# Patient Record
Sex: Male | Born: 1990 | Race: White | Hispanic: No | Marital: Single | Smoking: Former smoker
Health system: Southern US, Community
[De-identification: ages and names within clinical notes are randomized; demographics above are authoritative.]

## PROBLEM LIST (undated history)

## (undated) DIAGNOSIS — L720 Epidermal cyst: Secondary | ICD-10-CM

## (undated) DIAGNOSIS — B009 Herpesviral infection, unspecified: Secondary | ICD-10-CM

## (undated) HISTORY — PX: OTHER SURGICAL HISTORY: SHX169

## (undated) HISTORY — DX: Epidermal cyst: L72.0

---

## 2015-04-10 ENCOUNTER — Telehealth: Payer: Self-pay | Admitting: Surgery

## 2015-04-10 NOTE — Telephone Encounter (Signed)
Talked with patient's mother Darl PikesSusan on the phone. Darl PikesSusan reports that patient is having some pain and discomfort, but no fever or site irritation. I suggested that the patient should try Tylenol or Motrin to help relieve his pain. I directed Darl PikesSusan to take the patient to the ED if he develops a high fever, severe pain, or irritation / drainage at lipoma site. Darl PikesSusan confirmed understanding of directions and information.

## 2015-04-10 NOTE — Telephone Encounter (Signed)
Please call patients mother, Darl PikesSusan. Patient has an appointment with Dr Orvis BrillLoflin on Wednesday. He is a new patient with a possible Lipoma on his back. His mother states he is in a lot of pain and concerned it has become infected. I explained to her that we can't write any prescriptions until he has been seen in the office. She would like some suggestions on what she can give him and if she needs to take him to the ER. Thanks

## 2015-04-12 ENCOUNTER — Ambulatory Visit (INDEPENDENT_AMBULATORY_CARE_PROVIDER_SITE_OTHER): Payer: Self-pay | Admitting: Surgery

## 2015-04-12 ENCOUNTER — Encounter: Payer: Self-pay | Admitting: Surgery

## 2015-04-12 ENCOUNTER — Ambulatory Visit: Payer: Self-pay | Admitting: Surgery

## 2015-04-12 VITALS — BP 136/88 | HR 112 | Temp 98.3°F | Ht 70.0 in | Wt 297.0 lb

## 2015-04-12 DIAGNOSIS — L720 Epidermal cyst: Secondary | ICD-10-CM | POA: Insufficient documentation

## 2015-04-12 MED ORDER — AMOXICILLIN-POT CLAVULANATE 875-125 MG PO TABS: 1.0000 | ORAL_TABLET | Freq: Two times a day (BID) | ORAL | Status: DC

## 2015-04-12 NOTE — Progress Notes (Signed)
Subjective:     Patient ID: Matthew Gonzales, male   DOB: 1991/03/19, 24 y.o.   MRN: [id number]  HPI 24 yr old male with mass to back.  Patient states he has had the area for years.  He feels over the past 2 weeks it has been growing in size, he also states it has become painful and having some redness for the past few days.  Patient states he has not had it become red or painful before, has never noticed drainage from the area.  He also denies having any other skin lesions.  He denies any fever, chills, sob, chest pain, abd pain, n/v, constipation, diarrhea, or dysuria.   History reviewed. No pertinent past medical history. Past Surgical History  Procedure Laterality Date  . No past surgeries     Family History  Problem Relation Age of Onset  . Hypothyroidism Mother   . Hyperlipidemia Father   . Pancreatic cancer Maternal Grandmother   . Stroke Paternal Grandmother   . Cataracts Paternal Grandfather    Social History   Social History  . Marital Status: Single    Spouse Name: N/A  . Number of Children: N/A  . Years of Education: N/A   Social History Main Topics  . Smoking status: Current Every Day Smoker -- 0.50 packs/day    Types: Cigarettes  . Smokeless tobacco: None  . Alcohol Use: 0.0 oz/week    0 Standard drinks or equivalent per week     Comment: rarely  . Drug Use: No  . Sexual Activity: Not Asked   Other Topics Concern  . None   Social History Narrative  . None    Current outpatient prescriptions:  .  acetaminophen (TYLENOL) 500 MG tablet, Take 500 mg by mouth every 6 (six) hours as needed., Disp: , Rfl:  .  amoxicillin-clavulanate (AUGMENTIN) 875-125 MG tablet, Take 1 tablet by mouth 2 (two) times daily., Disp: 14 tablet, Rfl: 0 No Known Allergies   Review of Systems  Constitutional: Negative for fever, chills, activity change and fatigue.  HENT: Negative for congestion and sore throat.   Respiratory: Negative for cough, chest tightness and shortness of  breath.   Cardiovascular: Negative for chest pain, palpitations and leg swelling.  Gastrointestinal: Negative for nausea, vomiting, abdominal pain, diarrhea, constipation and abdominal distention.  Genitourinary: Negative for dysuria and hematuria.  Musculoskeletal: Negative for back pain and joint swelling.  Skin: Positive for color change, rash and wound. Negative for pallor.  Neurological: Negative for weakness and headaches.  Hematological: Negative for adenopathy. Does not bruise/bleed easily.  Psychiatric/Behavioral: Negative for confusion and agitation.       Filed Vitals:   04/12/15 1408  BP: 136/88  Pulse: 112  Temp: 98.3 F (36.8 C)    Objective:   Physical Exam  Constitutional: He is oriented to person, place, and time. He appears well-developed and well-nourished. No distress.  HENT:  Head: Normocephalic and atraumatic.  Right Ear: External ear normal.  Left Ear: External ear normal.  Nose: Nose normal.  Mouth/Throat: Oropharynx is clear and moist. No oropharyngeal exudate.  Eyes: Conjunctivae are normal. Pupils are equal, round, and reactive to light. No scleral icterus.  Neck: Normal range of motion. Neck supple. No tracheal deviation present. No thyromegaly present.  Cardiovascular: Normal rate, regular rhythm, normal heart sounds and intact distal pulses.  Exam reveals no gallop and no friction rub.   No murmur heard. Pulmonary/Chest: Effort normal and breath sounds normal. No respiratory distress. He has  no wheezes. He has no rales.  Abdominal: Soft. Bowel sounds are normal. He exhibits no distension. There is no tenderness. There is no rebound and no guarding.  Musculoskeletal: Normal range of motion. He exhibits no edema or tenderness.  Lymphadenopathy:    He has no cervical adenopathy.  Neurological: He is alert and oriented to person, place, and time. No cranial nerve deficit.  Skin: Skin is warm and dry. There is erythema.  midback approximately T7-8 area  with EIC, punctum could be seen but no drainage, some erythema but no fluctuance, some tenderness  Psychiatric: He has a normal mood and affect. His behavior is normal. Judgment and thought content normal.  Vitals reviewed.      Assessment:     24 yr old male with infected epidermal inclusion cyst of the mid back    Plan:     I discussed the risks, benefits, treatment options, alternatives and expected outcomes with the patient and his mother.  I explained that I would start him on an antibiotic to decrease the swelling and erythema surrounding the area.  I discussed that excision of the mass while in an inflamed state increases the likelihood of infection and recurrence of the lesion.  I also discussed that the procedure could be done in the office, by numbing the area around and excising the tissue.  I discussed the risk of bleeding, infection and recurrence of the lesion that comes with removal.  Patient and mother given opportunity to ask questions and have them answered.  Will set up for in office procedure for removal of midback EIC in 2-3 weeks.

## 2015-04-12 NOTE — Patient Instructions (Signed)
Please finish your course of antibiotic.  I would want for you to stop smoking for your own good.  Try over-the-counter Nexium, Pepcid and Maalox for your abdominal discomfort.

## 2015-04-19 ENCOUNTER — Telehealth: Payer: Self-pay | Admitting: Surgery

## 2015-04-19 MED ORDER — SULFAMETHOXAZOLE-TRIMETHOPRIM 800-160 MG PO TABS: 1.0000 | ORAL_TABLET | Freq: Two times a day (BID) | ORAL | Status: DC

## 2015-04-19 NOTE — Telephone Encounter (Signed)
Patient has called and wanted to inform nurse that all antibiotic has been completed as of this morning for possible infection/lipoma on back. He states that the place on back has not completely went away with pain and redness. Please call patient back .   Patient states that he will be at work till 4:00pm. If he will need another antibiotic he stated to call it in to his pharmacy and just leave him a detailed message. He will return the phone call if he is unable to answer.

## 2015-04-19 NOTE — Telephone Encounter (Signed)
Returned phone call to patient at this time. Denies fever.   Spoke with Dr. Tonita CongWoodham regarding this. He ordered Bactrim DS BID x 7 days.  Informed patient that this medication has been sent to [facility] at this time.  Encouraged patient to call in if he develops fever or if after finishing antibiotics that this area is still red and painful. Pt verbalizes understanding of conversation.

## 2015-04-19 NOTE — Telephone Encounter (Signed)
Patient has called again wanting to know if we will be calling in another antibiotic for him. Please call him and advise before the end of todays work day. Thank you.

## 2015-04-26 ENCOUNTER — Telehealth: Payer: Self-pay | Admitting: Surgery

## 2015-04-26 NOTE — Telephone Encounter (Signed)
Patient returned phone call at this time. He explains that area of "cyst" is slightly smaller after 2nd round of antibiotics, is slightly pink which is much better than before, and denies fever. But, he is concerned that this will not be well enough to excise on 12/27 with Dr. Orvis BrillLoflin.  I told him that unless it looks the same, worse, or he develops a fever; we will leave him off antibiotics currently but I did place him on schedule to see Dr. Tonita CongWoodham on 12/20 to make sure that this area does not need anything further prior to scheduled excision.  Encouraged patient to call if symptoms become worse or a fever develops.

## 2015-04-26 NOTE — Telephone Encounter (Signed)
Called patient back and had to leave a voicemail to return my call. ?

## 2015-04-26 NOTE — Telephone Encounter (Signed)
Patient called for an update, per patient he finished his ABX for the cyst on his back. Per patient swollen has gone down a little but still red and still there. Pain comes and goes.

## 2015-05-03 ENCOUNTER — Ambulatory Visit: Payer: Self-pay | Admitting: General Surgery

## 2015-05-04 ENCOUNTER — Ambulatory Visit (INDEPENDENT_AMBULATORY_CARE_PROVIDER_SITE_OTHER): Payer: Self-pay | Admitting: General Surgery

## 2015-05-04 ENCOUNTER — Encounter: Payer: Self-pay | Admitting: General Surgery

## 2015-05-04 ENCOUNTER — Encounter (INDEPENDENT_AMBULATORY_CARE_PROVIDER_SITE_OTHER): Payer: Self-pay

## 2015-05-04 VITALS — BP 144/84 | HR 97 | Temp 99.5°F | Ht 70.0 in | Wt 295.0 lb

## 2015-05-04 DIAGNOSIS — L720 Epidermal cyst: Secondary | ICD-10-CM

## 2015-05-04 MED ORDER — SULFAMETHOXAZOLE-TRIMETHOPRIM 800-160 MG PO TABS: 1.0000 | ORAL_TABLET | Freq: Two times a day (BID) | ORAL | Status: DC

## 2015-05-04 NOTE — Patient Instructions (Signed)
We have reordered your antibiotics. Stay on the Bactrim until you have been seen for your excision on 05/09/15.  Call with any questions or concerns prior to this appointment.

## 2015-05-04 NOTE — Progress Notes (Signed)
Outpatient Surgical Follow Up  05/04/2015  Matthew Gonzales is an 24 y.o. male.   Chief Complaint  Patient presents with  . Sebaceous Cyst    Scheduled for excision 05/09/15 with Dr. Orvis BrillLoflin    HPI: 24 year old male well-known to the Department returns to clinic for evaluation of a soft tissue mass on his mid back. Patient states that he's had 2 rounds of antibiotics and since stopping antibiotics one week ago he's noticed a return of pain and redness to the area. He doesn't think he's had any fevers but has felt warm. He denies any chills, nausea, vomiting, chest pain, shortness of breath, diarrhea, constipation. His only real complaint is of increasing pain to the area. He is concerned that the areas become infected again which might stop his planned excision next week.  History reviewed. No pertinent past medical history.  Past Surgical History  Procedure Laterality Date  . No past surgeries      Family History  Problem Relation Age of Onset  . Hypothyroidism Mother   . Hyperlipidemia Father   . Pancreatic cancer Maternal Grandmother   . Stroke Paternal Grandmother   . Cataracts Paternal Grandfather     Social History:  reports that he has been smoking Cigarettes.  He has been smoking about 0.50 packs per day. He has never used smokeless tobacco. He reports that he drinks alcohol. He reports that he does not use illicit drugs.  Allergies: No Known Allergies  Medications reviewed.    ROS A multipoint review of systems was completed, all pertinent positives and negatives are documented within the history of present illness and the remainder are negative.   BP 144/84 mmHg  Pulse 97  Temp(Src) 99.5 F (37.5 C) (Oral)  Ht 5\' 10"  (1.778 m)  Wt 133.811 kg (295 lb)  BMI 42.33 kg/m2  Physical Exam Gen.: No acute distress Chest: Clear to auscultation Heart: Regular rhythm Abdomen: Soft, nontender, nondistended Skin: Skin lesion present in the mid back with blanching  erythema. It is tender to touch. This is consistent with infection.    No results found for this or any previous visit (from the past 48 hour(s)). No results found.  Assessment/Plan:  1. Epidermal inclusion cyst 24 year old male with an epidermal inclusion cyst that appears to be infected. We'll restart antibiotics. Discussed that we should plan for excision next week regardless as this is his third course of antibiotics. He'll continue antibiotics through the next appointment. Prescription for Augmentin provided today.     Ricarda Frameharles Jalan Bodi, MD FACS General Surgeon  05/04/2015,2:23 PM

## 2015-05-09 ENCOUNTER — Encounter: Payer: Self-pay | Admitting: Surgery

## 2015-05-09 ENCOUNTER — Ambulatory Visit (INDEPENDENT_AMBULATORY_CARE_PROVIDER_SITE_OTHER): Payer: Self-pay | Admitting: Surgery

## 2015-05-09 VITALS — BP 113/80 | HR 108 | Temp 99.0°F | Ht 70.0 in | Wt 292.2 lb

## 2015-05-09 DIAGNOSIS — L02212 Cutaneous abscess of back [any part, except buttock]: Secondary | ICD-10-CM | POA: Insufficient documentation

## 2015-05-09 HISTORY — PX: CYST EXCISION: SHX5701

## 2015-05-09 MED ORDER — HYDROCODONE-ACETAMINOPHEN 10-325 MG PO TABS: 1.0000 | ORAL_TABLET | ORAL | Status: DC | PRN

## 2015-05-09 MED ORDER — SULFAMETHOXAZOLE-TRIMETHOPRIM 800-160 MG PO TABS: 1.0000 | ORAL_TABLET | Freq: Two times a day (BID) | ORAL | Status: DC

## 2015-05-09 NOTE — Procedures (Signed)
Incision and Drainage Procedure Note  Pre-operative Diagnosis: Midback abscess  Post-operative Diagnosis: same  Indications: Infected back abscess  Anesthesia: 1% plain lidocaine  Procedure Details  The procedure, risks and complications have been discussed in detail (including, but not limited to airway compromise, infection, bleeding) with the patient, and the patient has signed consent to the procedure.  The skin was sterilely prepped and draped over the affected area in the usual fashion. After adequate local anesthesia, I&D with a #15  blade was performed on the midline back. Copious amounts of came from the wound as well as some necrotic material was expressed.  Wound was inspected and some pockets of purulence were broken up.  The patient was observed until stable.  Findings: Purulent drainage and necrotic debris   EBL: 10 cc's  Drains: none  Condition: Tolerated procedure well   Complications: none.  Wound was packed with dry gauze, will have him return to clinic in AM for teaching dressing changes to his mother given the location.  Would consider wound vac placement but given that he does not have insurance would not be able to do this for the patient.  Rx for Vicodin given and refilled Bactrim as well.

## 2015-05-09 NOTE — Progress Notes (Signed)
24 yr old male with infected EIC on midback.  He was initially seen about three weeks prior and started on antibiotics.  The redness and infection subsided on the antibiotic but recurred within a couple days of stopping.  He was restarted on antibiotics and was seen by my partner Dr. Tonita CongWoodham who restarted him on them.  He has continued to have pain and redness in the area despite three weeks of antibiotics.    Filed Vitals:   05/09/15 1604  BP: 113/80  Pulse: 108  Temp: 99 F (37.2 C)   PE:  Gen: NAD Res: CTAB/L Cardio: RRR  Abd: soft, nt, nd Skin: midback 5cm area of chronic inflammation, no frank fluctuance or cellulitis   A/P:  I discussed with patient that given the ongoing nature we needed to perform an incision and drainage of the area to get the infection removed so it could heal. I discussed the risks, benefits, alternatives, and expected outcomes.  Consent was obtained.

## 2015-05-10 ENCOUNTER — Ambulatory Visit (INDEPENDENT_AMBULATORY_CARE_PROVIDER_SITE_OTHER): Payer: Self-pay | Admitting: Surgery

## 2015-05-10 ENCOUNTER — Encounter: Payer: Self-pay | Admitting: Surgery

## 2015-05-10 VITALS — BP 130/84 | HR 99 | Temp 98.4°F | Wt 295.0 lb

## 2015-05-10 DIAGNOSIS — L02212 Cutaneous abscess of back [any part, except buttock]: Secondary | ICD-10-CM

## 2015-05-10 NOTE — Progress Notes (Signed)
24 yr old male POD#1 from abscess drainage of midback.  Patient states that he did well overnight.  He denies much pain states some soreness.  The overbandage had some drainage last night which they changed.    Filed Vitals:   05/10/15 1641  BP: 130/84  Pulse: 99  Temp: 98.4 F (36.9 C)    PE:  Gen: NAD Back:  Wound:  Erythema beginning to regress, packing removed, good granulation tissue, repacked wound and taught parents.   A/P:  Will have him pack the wound with wet to dry twice daily, he can work and if gets sweaty can shower and then change packing afterward.  Parents here today too, and taught how to do the packing.  Supplies given.  Will have him f/u in 1 week for wound check

## 2015-05-17 ENCOUNTER — Ambulatory Visit (INDEPENDENT_AMBULATORY_CARE_PROVIDER_SITE_OTHER): Payer: Self-pay | Admitting: Surgery

## 2015-05-17 ENCOUNTER — Encounter: Payer: Self-pay | Admitting: Surgery

## 2015-05-17 VITALS — BP 119/82 | HR 91 | Temp 98.6°F | Ht 70.0 in | Wt 298.2 lb

## 2015-05-17 DIAGNOSIS — L02212 Cutaneous abscess of back [any part, except buttock]: Secondary | ICD-10-CM

## 2015-05-17 NOTE — Progress Notes (Signed)
25 yr old male with back abscess.  He has been doing well, states no pain, redness or fevers.  Dad has been packing twice daily without issues.   Filed Vitals:   05/17/15 1409  BP: 119/82  Pulse: 91  Temp: 98.6 F (37 C)   PE:  Gen: NAD Back:  4cm wound, granulation tissue throughout wound only  2mm below right side, small cavity still to the left side   A/P:  Healing remarkably well, continue packing twice daily, will have him return in 2 weeks for wound check.

## 2015-05-31 ENCOUNTER — Encounter: Payer: Self-pay | Admitting: Surgery

## 2015-05-31 ENCOUNTER — Ambulatory Visit (INDEPENDENT_AMBULATORY_CARE_PROVIDER_SITE_OTHER): Payer: Self-pay | Admitting: Surgery

## 2015-05-31 VITALS — BP 131/72 | HR 86 | Temp 98.1°F | Ht 70.0 in | Wt 299.0 lb

## 2015-05-31 DIAGNOSIS — L02212 Cutaneous abscess of back [any part, except buttock]: Secondary | ICD-10-CM

## 2015-05-31 DIAGNOSIS — L720 Epidermal cyst: Secondary | ICD-10-CM

## 2015-06-01 ENCOUNTER — Encounter: Payer: Self-pay | Admitting: Surgery

## 2015-06-01 NOTE — Progress Notes (Signed)
25 year old with abscess on his back. He has been packing and at home and states the packing has gradually been able to decrease as has drainage. States that he does feel some itching in the area but he does not have pain. HE denies any fever or chills  Filed Vitals:   05/31/15 1559  BP: 131/72  Pulse: 86  Temp: 98.1 F (36.7 C)   PE:  Gen:NAD Back: incision site healing well with good granulation tissue, about half the size from last visit.  Re packed with iodoform 1/2' gauze  A/P:  Healing well from abscess on the back change packing to half-inch iodoform gauze. Instructed him to continue doing this until the wound was completely healed over. This should take another 2 weeks. Directed him that he should follow-up with [place] in 2-3 months to attempt to remove the rest of the cyst wall.

## 2015-10-17 ENCOUNTER — Encounter: Payer: Self-pay | Admitting: *Deleted

## 2015-10-17 ENCOUNTER — Ambulatory Visit
Admission: EM | Admit: 2015-10-17 | Discharge: 2015-10-17 | Disposition: A | Discharge status: Home / Self Care | Payer: Self-pay | Attending: Family Medicine | Admitting: Family Medicine

## 2015-10-17 DIAGNOSIS — J189 Pneumonia, unspecified organism: Secondary | ICD-10-CM

## 2015-10-17 DIAGNOSIS — J029 Acute pharyngitis, unspecified: Secondary | ICD-10-CM

## 2015-10-17 LAB — CBC WITH DIFFERENTIAL/PLATELET
BASOS PCT: 1 %
Basophils Absolute: 0.1 10*3/uL (ref 0–0.1)
Eosinophils Absolute: 0.1 10*3/uL (ref 0–0.7)
Eosinophils Relative: 2 %
HEMATOCRIT: 48.9 % (ref 40.0–52.0)
Hemoglobin: 16.4 g/dL (ref 13.0–18.0)
LYMPHS ABS: 2.2 10*3/uL (ref 1.0–3.6)
LYMPHS PCT: 28 %
MCH: 29.7 pg (ref 26.0–34.0)
MCHC: 33.5 g/dL (ref 32.0–36.0)
MCV: 88.7 fL (ref 80.0–100.0)
MONO ABS: 0.8 10*3/uL (ref 0.2–1.0)
MONOS PCT: 9 %
NEUTROS ABS: 4.8 10*3/uL (ref 1.4–6.5)
Neutrophils Relative %: 60 %
Platelets: 197 10*3/uL (ref 150–440)
RBC: 5.51 MIL/uL (ref 4.40–5.90)
RDW: 14.3 % (ref 11.5–14.5)
WBC: 8 10*3/uL (ref 3.8–10.6)

## 2015-10-17 LAB — RAPID STREP SCREEN (MED CTR MEBANE ONLY): Streptococcus, Group A Screen (Direct): NEGATIVE

## 2015-10-17 LAB — MONONUCLEOSIS SCREEN: Mono Screen: NEGATIVE

## 2015-10-17 MED ORDER — AZITHROMYCIN 250 MG PO TABS: ORAL_TABLET | ORAL | Status: DC

## 2015-10-17 MED ORDER — CEFUROXIME AXETIL 500 MG PO TABS: 500.0000 mg | ORAL_TABLET | Freq: Two times a day (BID) | ORAL | Status: DC

## 2015-10-17 NOTE — ED Provider Notes (Signed)
CSN: [id number]     Arrival date & time 10/17/15  1629 History   First MD Initiated Contact with Patient 10/17/15 1825    Nurses notes were reviewed.  Chief Complaint  Patient presents with  . Sore Throat   She is here because sore throat. States it startedexudate more responsible and spoke last night. He reports about 6 months ago to a year he is not sure of the time he had exudative pharyngitis. States that he went to Augmentin doses than the Zithromax and Augmentin dose before he finally had the infection resolved. Before he was take given the Zithromax he did have a positive strep on the second visit on the first visit his strep test was negative. He reports this happened soon after performing oral sex partner. This Saturday he performed oral sex on a new partner. He states about 2 weeks ago his HIV test came back negative. He is wondering if his immune system is bad or if it was because of the performing oral sex. He states that he has performed oral sex on other partners in the interim and he had had no problems.   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 He does smoke. He is a history epidermal inclusion cyst. Mother and father positive for hypothyroidism hyperlipidemia maternal grandmothers had pancreatic cancer.   (Consider location/radiation/quality/duration/timing/severity/associated sxs/prior Treatment) Patient is a 25 y.o. male presenting with pharyngitis. The history is provided by the patient. No language interpreter was used.  Sore Throat This is a new problem. The current episode started yesterday. The problem occurs constantly. The problem has been gradually worsening. Pertinent negatives include no chest pain, no abdominal pain, no headaches and no shortness of breath. Nothing aggravates the symptoms. Nothing relieves the symptoms. He has tried nothing for the symptoms. The treatment provided no relief.    Past Medical History  Diagnosis Date  . Epidermal  inclusion cyst    Past Surgical History  Procedure Laterality Date  . Cyst excision  05/09/15    Epidermal Inclusion Cyst- Back (Dr. Orvis BrillLoflin)  . Vertical sleeve gastrectomy     Family History  Problem Relation Age of Onset  . Hypothyroidism Mother   . Hyperlipidemia Father   . Pancreatic cancer Maternal Grandmother   . Stroke Paternal Grandmother   . Cataracts Paternal Grandfather    Social History  Substance Use Topics  . Smoking status: Current Every Day Smoker -- 0.50 packs/day    Types: Cigarettes  . Smokeless tobacco: Never Used  . Alcohol Use: 0.0 oz/week    0 Standard drinks or equivalent per week     Comment: rarely    Review of Systems  Respiratory: Negative for shortness of breath.   Cardiovascular: Negative for chest pain.  Gastrointestinal: Negative for abdominal pain.  Neurological: Negative for headaches.  All other systems reviewed and are negative.   Allergies  Review of patient's allergies indicates no known allergies.  Home Medications   Prior to Admission medications   Medication Sig Start Date End Date Taking? Authorizing Provider  acyclovir (ZOVIRAX) 200 MG capsule Take 600 mg by mouth 5 (five) times daily.   Yes Historical Provider, MD  azithromycin (ZITHROMAX Z-PAK) 250 MG tablet Use as per package instructions 10/17/15   Lutricia FeilWilliam P Roemer, PA-C  cefUROXime (CEFTIN) 500 MG tablet Take 1 tablet (500 mg total) by mouth 2 (two) times daily. 10/17/15   Hassan RowanEugene Deklyn Trachtenberg, MD   Meds Ordered and Administered this Visit  Medications -  No data to display  BP 116/81 mmHg  Pulse 86  Temp(Src) 98.6 F (37 C) (Oral)  Resp 16  Ht  (1.778 m)  Wt 220 lb (99.791 kg)  BMI 31.57 kg/m2  SpO2 100% No data found.   Physical Exam  Constitutional: He is oriented to person, place, and time. He appears well-developed and well-nourished.  HENT:  Head: Atraumatic.  Right Ear: Hearing, tympanic membrane, external ear and ear canal normal.  Left Ear: Hearing,  tympanic membrane, external ear and ear canal normal.  Nose: No mucosal edema or rhinorrhea. Right sinus exhibits no maxillary sinus tenderness and no frontal sinus tenderness. Left sinus exhibits no frontal sinus tenderness.  Mouth/Throat: Uvula is midline. No dental caries. Oropharyngeal exudate and posterior oropharyngeal erythema present.    Eyes: Pupils are equal, round, and reactive to light.  Neck: Normal range of motion. Neck supple.  Cardiovascular: Normal rate, regular rhythm and normal heart sounds.   Pulmonary/Chest: Effort normal and breath sounds normal.  Musculoskeletal: Normal range of motion. He exhibits no tenderness.  Lymphadenopathy:    He has cervical adenopathy.  Neurological: He is alert and oriented to person, place, and time.  Skin: Skin is warm and dry.  Psychiatric: He has a normal mood and affect.  Vitals reviewed.   ED Course  Procedures (including critical care time)  Labs Review Labs Reviewed  RAPID STREP SCREEN (NOT AT [facility])  CHLAMYDIA/NGC RT PCR ([facility] ONLY)  CULTURE, GROUP A STREP ([facility])  CBC WITH DIFFERENTIAL/PLATELET  MONONUCLEOSIS SCREEN    Imaging Review No results found.   Visual Acuity Review  Right Eye Distance:   Left Eye Distance:   Bilateral Distance:    Right Eye Near:   Left Eye Near:    Bilateral Near:      Results for orders placed or performed during the hospital encounter of 10/17/15  Rapid strep screen  Result Value Ref Range   Streptococcus, Group A Screen (Direct) NEGATIVE NEGATIVE  CBC with Differential  Result Value Ref Range   WBC 8.0 3.8 - 10.6 K/uL   RBC 5.51 4.40 - 5.90 MIL/uL   Hemoglobin 16.4 13.0 - 18.0 g/dL   HCT 81.1 91.4 - 78.2 %   MCV 88.7 80.0 - 100.0 fL   MCH 29.7 26.0 - 34.0 pg   MCHC 33.5 32.0 - 36.0 g/dL   RDW 95.6 21.3 - 08.6 %   Platelets 197 150 - 440 K/uL   Neutrophils Relative % 60 %   Neutro Abs 4.8 1.4 - 6.5 K/uL   Lymphocytes Relative 28 %   Lymphs Abs 2.2 1.0 - 3.6  K/uL   Monocytes Relative 9 %   Monocytes Absolute 0.8 0.2 - 1.0 K/uL   Eosinophils Relative 2 %   Eosinophils Absolute 0.1 0 - 0.7 K/uL   Basophils Relative 1 %   Basophils Absolute 0.1 0 - 0.1 K/uL  Mononucleosis screen  Result Value Ref Range   Mono Screen NEGATIVE NEGATIVE    MDM   1. Acute pharyngitis, unspecified etiology     Since Zithromax is filled before and took 2 rounds of Augmentin to eradicate the last pharyngitis we'll place him on Ceftin 500 mg 1 tablet twice a day for 10 days GC and Chlamydia tests still pending if Chlamydia is positive we'll need to put him on some doxycycline. Work note given for tomorrow.Note: This dictation was prepared with Dragon dictation along with smaller phrase technology. Any transcriptional errors that result from this  process are unintentional.    Hassan Rowan, MD 10/17/15 1946

## 2015-10-17 NOTE — Discharge Instructions (Signed)
Community-Acquired Pneumonia, Adult Pneumonia is an infection of the lungs. One type of pneumonia can happen while a person is in a hospital. A different type can happen when a person is not in a hospital (community-acquired pneumonia). It is easy for this kind to spread from person to person. It can spread to you if you breathe near an infected person who coughs or sneezes. Some symptoms include:  A dry cough.  A wet (productive) cough.  Fever.  Sweating.  Chest pain. HOME CARE  Take over-the-counter and prescription medicines only as told by your doctor.  Only take cough medicine if you are losing sleep.  If you were prescribed an antibiotic medicine, take it as told by your doctor. Do not stop taking the antibiotic even if you start to feel better.  Sleep with your head and neck raised (elevated). You can do this by putting a few pillows under your head, or you can sleep in a recliner.  Do not use tobacco products. These include cigarettes, chewing tobacco, and e-cigarettes. If you need help quitting, ask your doctor.  Drink enough water to keep your pee (urine) clear or pale yellow. A shot (vaccine) can help prevent pneumonia. Shots are often suggested for:  People older than 27 years of age.  People older than 27 years of age:  Who are having cancer treatment.  Who have long-term (chronic) lung disease.  Who have problems with their body's defense system (immune system). You may also prevent pneumonia if you take these actions:  Get the flu (influenza) shot every year.  Go to the dentist as often as told.  Wash your hands often. If soap and water are not available, use hand sanitizer. GET HELP IF:  You have a fever.  You lose sleep because your cough medicine does not help. GET HELP RIGHT AWAY IF:  You are short of breath and it gets worse.  You have more chest pain.  Your sickness gets worse. This is very serious if:  You are an older adult.  Your  body's defense system is weak.  You cough up blood.   This information is not intended to replace advice given to you by your health care provider. Make sure you discuss any questions you have with your health care provider.   Document Released: 10/16/2007 Document Revised: 01/18/2015 Document Reviewed: 08/24/2014 Elsevier Interactive Patient Education 2016 Elsevier Inc.  Pharyngitis Pharyngitis is a sore throat (pharynx). There is redness, pain, and swelling of your throat. HOME CARE   Drink enough fluids to keep your pee (urine) clear or pale yellow.  Only take medicine as told by your doctor.  You may get sick again if you do not take medicine as told. Finish your medicines, even if you start to feel better.  Do not take aspirin.  Rest.  Rinse your mouth (gargle) with salt water ( tsp of salt per 1 qt of water) every 1-2 hours. This will help the pain.  If you are not at risk for choking, you can suck on hard candy or sore throat lozenges. GET HELP IF:  You have large, tender lumps on your neck.  You have a rash.  You cough up green, yellow-brown, or bloody spit. GET HELP RIGHT AWAY IF:   You have a stiff neck.  You drool or cannot swallow liquids.  You throw up (vomit) or are not able to keep medicine or liquids down.  You have very bad pain that does not go away with  medicine.  You have problems breathing (not from a stuffy nose). MAKE SURE YOU:   Understand these instructions.  Will watch your condition.  Will get help right away if you are not doing well or get worse.   This information is not intended to replace advice given to you by your health care provider. Make sure you discuss any questions you have with your health care provider.   Document Released: 10/16/2007 Document Revised: 02/17/2013 Document Reviewed: 01/04/2013 Elsevier Interactive Patient Education [employer]

## 2015-10-17 NOTE — ED Notes (Signed)
Onset of sore throat today. Pt relates sore throat possibly to oral sex last night.

## 2015-10-19 LAB — CT/GC NAA, PHARYNGEAL
C TRACH RRNA NPH QL PCR: NEGATIVE
N GONORRHOEA RRNA NPH QL PCR: NEGATIVE

## 2015-10-19 LAB — CHLAMYDIA/NGC RT PCR (ARMC ONLY)

## 2015-10-20 LAB — CULTURE, GROUP A STREP (THRC)

## 2016-11-20 ENCOUNTER — Ambulatory Visit (INDEPENDENT_AMBULATORY_CARE_PROVIDER_SITE_OTHER): Payer: Worker's Compensation

## 2016-11-20 ENCOUNTER — Ambulatory Visit
Admission: EM | Admit: 2016-11-20 | Discharge: 2016-11-20 | Disposition: A | Discharge status: Home / Self Care | Payer: Worker's Compensation | Attending: Family Medicine | Admitting: Family Medicine

## 2016-11-20 DIAGNOSIS — S61031A Puncture wound without foreign body of right thumb without damage to nail, initial encounter: Secondary | ICD-10-CM | POA: Diagnosis not present

## 2016-11-20 DIAGNOSIS — W319XXA Contact with unspecified machinery, initial encounter: Secondary | ICD-10-CM | POA: Diagnosis not present

## 2016-11-20 HISTORY — DX: Herpesviral infection, unspecified: B00.9

## 2016-11-20 MED ORDER — AMOXICILLIN 875 MG PO TABS: 875.0000 mg | ORAL_TABLET | Freq: Two times a day (BID) | ORAL | 0 refills | Status: DC

## 2016-11-20 MED ORDER — TETANUS-DIPHTH-ACELL PERTUSSIS 5-2.5-18.5 LF-MCG/0.5 IM SUSP: 0.5000 mL | Freq: Once | INTRAMUSCULAR | Status: DC

## 2016-11-20 MED ORDER — TETANUS-DIPHTHERIA TOXOIDS TD 5-2 LFU IM INJ
0.5000 mL | INJECTION | Freq: Once | INTRAMUSCULAR | Status: AC
  Administered 2016-11-20: 0.5 mL via INTRAMUSCULAR

## 2016-11-20 NOTE — Discharge Instructions (Signed)
Td Vaccine (Tetanus and Diphtheria): What You Need to Know 1. Why get vaccinated? Tetanus  and diphtheria are very serious diseases. They are rare in the [place] today, but people who do become infected often have severe complications. Td vaccine is used to protect adolescents and adults from both of these diseases. Both tetanus and diphtheria are infections caused by bacteria. Diphtheria spreads from person to person through coughing or sneezing. Tetanus-causing bacteria enter the body through cuts, scratches, or wounds. TETANUS (lockjaw) causes painful muscle tightening and stiffness, usually all over the body.  It can lead to tightening of muscles in the head and neck so you can't open your mouth, swallow, or sometimes even breathe. Tetanus kills about 1 out of every 10 people who are infected even after receiving the best medical care.  DIPHTHERIA can cause a thick coating to form in the back of the throat.  It can lead to breathing problems, paralysis, heart failure, and death.  Before vaccines, as many as 200,000 cases of diphtheria and hundreds of cases of tetanus were reported in the [place] each year. Since vaccination began, reports of cases for both diseases have dropped by about 99%. 2. Td vaccine Td vaccine can protect adolescents and adults from tetanus and diphtheria. Td is usually given as a booster dose every 10 years but it can also be given earlier after a severe and dirty wound or burn. Another vaccine, called Tdap, which protects against pertussis in addition to tetanus and diphtheria, is sometimes recommended instead of Td vaccine. Your doctor or the person giving you the vaccine can give you more information. Td may safely be given at the same time as other vaccines. 3. Some people should not get this vaccine  A person who has ever had a life-threatening allergic reaction after a previous dose of any tetanus or diphtheria containing vaccine, OR has a severe  allergy to any part of this vaccine, should not get Td vaccine. Tell the person giving the vaccine about any severe allergies.  Talk to your doctor if you: ? had severe pain or swelling after any vaccine containing diphtheria or tetanus, ? ever had a condition called Guillain Barre Syndrome (GBS), ? aren't feeling well on the day the shot is scheduled. 4. What are the risks from Td vaccine? With any medicine, including vaccines, there is a chance of side effects. These are usually mild and go away on their own. Serious reactions are also possible but are rare. Most people who get Td vaccine do not have any problems with it. Mild problems following Td vaccine: (Did not interfere with activities)  Pain where the shot was given (about 8 people in 10)  Redness or swelling where the shot was given (about 1 person in 4)  Mild fever (rare)  Headache (about 1 person in 4)  Tiredness (about 1 person in 4)  Moderate problems following Td vaccine: (Interfered with activities, but did not require medical attention)  Fever over 102F (rare)  Severe problems following Td vaccine: (Unable to perform usual activities; required medical attention)  Swelling, severe pain, bleeding and/or redness in the arm where the shot was given (rare).  Problems that could happen after any vaccine:  People sometimes faint after a medical procedure, including vaccination. Sitting or lying down for about 15 minutes can help prevent fainting, and injuries caused by a fall. Tell your doctor if you feel dizzy, or have vision changes or ringing in the ears.  Some people get   severe pain in the shoulder and have difficulty moving the arm where a shot was given. This happens very rarely.  Any medication can cause a severe allergic reaction. Such reactions from a vaccine are very rare, estimated at fewer than 1 in a million doses, and would happen within a few minutes to a few hours after the vaccination. As with any  medicine, there is a very remote chance of a vaccine causing a serious injury or death. The safety of vaccines is always being monitored. For more information, visit: www.cdc.gov/vaccinesafety/ 5. What if there is a serious reaction? What should I look for? Look for anything that concerns you, such as signs of a severe allergic reaction, very high fever, or unusual behavior. Signs of a severe allergic reaction can include hives, swelling of the face and throat, difficulty breathing, a fast heartbeat, dizziness, and weakness. These would usually start a few minutes to a few hours after the vaccination. What should I do?  If you think it is a severe allergic reaction or other emergency that can't wait, call 9-1-1 or get the person to the nearest hospital. Otherwise, call your doctor.  Afterward, the reaction should be reported to the Vaccine Adverse Event Reporting System (VAERS). Your doctor might file this report, or you can do it yourself through the VAERS web site at www.vaers.hhs.gov, or by calling 1-800-822-7967. ? VAERS does not give medical advice. 6. The National Vaccine Injury Compensation Program The National Vaccine Injury Compensation Program (VICP) is a federal program that was created to compensate people who may have been injured by certain vaccines. Persons who believe they may have been injured by a vaccine can learn about the program and about filing a claim by calling 1-800-338-2382 or visiting the VICP website at www.hrsa.gov/vaccinecompensation. There is a time limit to file a claim for compensation. 7. How can I learn more?  Ask your doctor. He or she can give you the vaccine package insert or suggest other sources of information.  Call your local or state health department.  Contact the Centers for Disease Control and Prevention (CDC): ? Call 1-800-232-4636 (1-800-CDC-INFO) ? Visit CDC's website at www.cdc.gov/vaccines CDC Td Vaccine VIS (08/22/15) This information is  not intended to replace advice given to you by your health care provider. Make sure you discuss any questions you have with your health care provider. Document Released: 02/24/2006 Document Revised: 01/18/2016 Document Reviewed: 01/18/2016 Elsevier Interactive Patient Education  2017 Elsevier Inc.  

## 2016-11-20 NOTE — ED Triage Notes (Signed)
26 year old [place] male is here today with complaints of right thumb pain that he injured at his job today. He states he was setting up a machine when one of the needles cut through his right thumb nail.

## 2016-11-20 NOTE — ED Provider Notes (Signed)
[facility]    CSN: [id number] Arrival date & time: 11/20/16  1623     History   Chief Complaint Chief Complaint  Patient presents with  . Hand Pain    Right thumb    HPI Matthew Gonzales is a 26 y.o. male.   26 yo male with a c/o right thumb pain after he sustained a puncture wound at work. 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 Patient not sure how deep it punctured and not sure if any piece of metal embedded inside. States he can move his thumb without any problem and not actively bleeding.    The history is provided by the patient.  Hand Pain     Past Medical History:  Diagnosis Date  . Epidermal inclusion cyst   . HSV-2 (herpes simplex virus 2) infection     Patient Active Problem List   Diagnosis Date Noted  . Abscess of back 05/09/2015  . Epidermal inclusion cyst 04/12/2015    Past Surgical History:  Procedure Laterality Date  . CYST EXCISION  05/09/15   Epidermal Inclusion Cyst- Back (Dr. Orvis Brill)  . vertical sleeve gastrectomy         Home Medications    Prior to Admission medications   Medication Sig Start Date End Date Taking? Authorizing Provider  acyclovir (ZOVIRAX) 200 MG capsule Take 600 mg by mouth 5 (five) times daily.   Yes [provider]  amoxicillin (AMOXIL) 875 MG tablet Take 1 tablet (875 mg total) by mouth 2 (two) times daily. 11/20/16   Payton Mccallum, MD    Family History Family History  Problem Relation Age of Onset  . Hypothyroidism Mother   . Hyperlipidemia Father   . Pancreatic cancer Maternal Grandmother   . Stroke Paternal Grandmother   . Cataracts Paternal Grandfather     Social History Social History  Substance Use Topics  . Smoking status: Current Every Day Smoker    Packs/day: 0.50    Types: Cigarettes  . Smokeless tobacco: Current User  . Alcohol use 0.0 oz/week     Comment: rarely     Allergies   Patient has no known  allergies.   Review of Systems Review of Systems   Physical Exam Triage Vital Signs ED Triage Vitals  Enc Vitals Group     BP 11/20/16 1702 121/81     Pulse Rate 11/20/16 1702 81     Resp 11/20/16 1702 16     Temp 11/20/16 1702 98.1 F (36.7 C)     Temp Source 11/20/16 1702 Oral     SpO2 11/20/16 1702 100 %     Weight 11/20/16 1705 180 lb (81.6 kg)     Height 11/20/16 1705 5\' 10"  (1.778 m)     Head Circumference --      Peak Flow --      Pain Score 11/20/16 1706 6     Pain Loc --      Pain Edu? --      Excl. in GC? --    No data found.   Updated Vital Signs BP 121/81 (BP Location: Right Arm)   Pulse 81   Temp 98.1 F (36.7 C) (Oral)   Resp 16   Ht 5\' 10"  (1.778 m)   Wt 180 lb (81.6 kg)   SpO2 100%   BMI 25.83 kg/m   Visual Acuity Right Eye Distance:   Left Eye Distance:   Bilateral Distance:  Right Eye Near:   Left Eye Near:    Bilateral Near:     Physical Exam  Constitutional: He appears well-developed and well-nourished. No distress.  Musculoskeletal:       Right hand: He exhibits tenderness (over right thumb nail; nail with puncture wound noted; no subungal hematoma; normal range of motion). He exhibits normal range of motion, no bony tenderness, normal two-point discrimination, normal capillary refill, no laceration and no swelling. Normal sensation noted. Normal strength noted.  Skin: He is not diaphoretic.  Nursing note and vitals reviewed.    UC Treatments / Results  Labs (all labs ordered are listed, but only abnormal results are displayed) Labs Reviewed - No data to display  EKG  EKG Interpretation None       Radiology Dg Finger Thumb Right  Result Date: 11/20/2016 CLINICAL DATA:  Stab to right thumb with piece of metal EXAM: RIGHT THUMB 2+V COMPARISON:  None. FINDINGS: There is no evidence of fracture or dislocation. There is no evidence of arthropathy or other focal bone abnormality. Soft tissues are unremarkable IMPRESSION:  Negative. Electronically Signed   By: Jasmine PangKim  Fujinaga M.D.   On: 11/20/2016 18:06    Procedures Procedures (including critical care time)  Medications Ordered in UC Medications  tetanus & diphtheria toxoids (adult) (TENIVAC) injection 0.5 mL (0.5 mLs Intramuscular Given 11/20/16 1802)     Initial Impression / Assessment and Plan / UC Course  I have reviewed the triage vital signs and the nursing notes.  Pertinent labs & imaging results that were available during my care of the patient were reviewed by me and considered in my medical decision making (see chart for details).       Final Clinical Impressions(s) / UC Diagnoses   Final diagnoses:  Puncture wound of right thumb, initial encounter    New Prescriptions New Prescriptions   AMOXICILLIN (AMOXIL) 875 MG TABLET    Take 1 tablet (875 mg total) by mouth 2 (two) times daily.   1. x-ray results and diagnosis reviewed with patient; given tetanus vaccine 2. rx as per orders above; reviewed possible side effects, interactions, risks and benefits; prophylaxis  3. Recommend supportive treatment with routine wound care 4. Follow-up prn if symptoms worsen or don't improve   Payton Mccallumonty, Render Marley, MD 11/20/16 [phone]

## 2018-08-03 ENCOUNTER — Other Ambulatory Visit: Payer: Self-pay

## 2018-08-03 ENCOUNTER — Ambulatory Visit
Admission: EM | Admit: 2018-08-03 | Discharge: 2018-08-03 | Disposition: A | Discharge status: Home / Self Care | Payer: Worker's Compensation | Attending: Family Medicine | Admitting: Family Medicine

## 2018-08-03 DIAGNOSIS — S161XXA Strain of muscle, fascia and tendon at neck level, initial encounter: Secondary | ICD-10-CM

## 2018-08-03 DIAGNOSIS — X500XXA Overexertion from strenuous movement or load, initial encounter: Secondary | ICD-10-CM

## 2018-08-03 DIAGNOSIS — Z042 Encounter for examination and observation following work accident: Secondary | ICD-10-CM | POA: Diagnosis not present

## 2018-08-03 MED ORDER — METAXALONE 800 MG PO TABS: 800.0000 mg | ORAL_TABLET | Freq: Three times a day (TID) | ORAL | 0 refills | Status: DC | PRN

## 2018-08-03 MED ORDER — MELOXICAM 15 MG PO TABS: 15.0000 mg | ORAL_TABLET | Freq: Every day | ORAL | 0 refills | Status: DC | PRN

## 2018-08-03 NOTE — ED Provider Notes (Signed)
[facility]    CSN: [id number] Arrival date & time: 08/03/18  1422  History   Chief Complaint Chief Complaint  Patient presents with  . Work Related Injury  . Neck Pain   HPI  28 year old male presents with neck pain.  Patient reports that he injured his neck on 3/19.  He injured his neck while he was carrying a heavy beam on his shoulder while going up a ladder.  Reports that his pain is located posteriorly on the right. Worse with movement/ROM.  He has seen a Land.  He states that he has had x-rays as well as treatments.  He has had no resolution.  He has also been placed in a soft c-collar.  Continues to have quite severe pain.  He reports that he had some paresthesias of his hands but this has now resolved.  No other associated symptoms.  Patient states he is also taken ibuprofen without improvement.  Use his girlfriends muscle relaxer without improvement as well.  No other complaints.   PMH, Surgical Hx, Family Hx, Social History reviewed and updated as below.  Past Medical History:  Diagnosis Date  . Epidermal inclusion cyst   . HSV-2 (herpes simplex virus 2) infection     Patient Active Problem List   Diagnosis Date Noted  . Abscess of back 05/09/2015  . Epidermal inclusion cyst 04/12/2015    Past Surgical History:  Procedure Laterality Date  . CYST EXCISION  05/09/15   Epidermal Inclusion Cyst- Back (Dr. Orvis Brill)  . vertical sleeve gastrectomy      Home Medications    Prior to Admission medications   Medication Sig Start Date End Date Taking? Authorizing Provider  meloxicam (MOBIC) 15 MG tablet Take 1 tablet (15 mg total) by mouth daily as needed for pain. 08/03/18   Tommie Sams, DO  metaxalone (SKELAXIN) 800 MG tablet Take 1 tablet (800 mg total) by mouth 3 (three) times daily as needed for muscle spasms. 08/03/18   Tommie Sams, DO    Family History Family History  Problem Relation Age of Onset  . Hypothyroidism Mother   .  Hyperlipidemia Father   . Pancreatic cancer Maternal Grandmother   . Stroke Paternal Grandmother   . Cataracts Paternal Grandfather     Social History Social History   Tobacco Use  . Smoking status: Former Smoker    Packs/day: 0.50    Types: Cigarettes  . Smokeless tobacco: Never Used  Substance Use Topics  . Alcohol use: Yes    Alcohol/week: 0.0 standard drinks    Comment: rarely  . Drug use: No     Allergies   Patient has no known allergies.   Review of Systems Review of Systems  Constitutional: Negative.   Musculoskeletal: Positive for neck pain.   Physical Exam Triage Vital Signs ED Triage Vitals  Enc Vitals Group     BP 08/03/18 1452 (!) 132/92     Pulse Rate 08/03/18 1452 84     Resp 08/03/18 1452 16     Temp 08/03/18 1452 98.4 F (36.9 C)     Temp Source 08/03/18 1452 Oral     SpO2 08/03/18 1452 100 %     Weight 08/03/18 1450 200 lb (90.7 kg)     Height 08/03/18 1450 5\' 10"  (1.778 m)     Head Circumference --      Peak Flow --      Pain Score 08/03/18 1450 9     Pain Loc --  Pain Edu? --      Excl. in GC? --    Updated Vital Signs BP (!) 132/92 (BP Location: Left Arm)   Pulse 84   Temp 98.4 F (36.9 C) (Oral)   Resp 16   Ht 5\' 10"  (1.778 m)   Wt 90.7 kg   SpO2 100%   BMI 28.70 kg/m   Visual Acuity Right Eye Distance:   Left Eye Distance:   Bilateral Distance:    Right Eye Near:   Left Eye Near:    Bilateral Near:     Physical Exam Vitals signs and nursing note reviewed.  Constitutional:      General: He is not in acute distress.    Appearance: Normal appearance.  HENT:     Head: Normocephalic and atraumatic.  Eyes:     General:        Right eye: No discharge.        Left eye: No discharge.     Conjunctiva/sclera: Conjunctivae normal.  Neck:     Comments: Patient with right trapezius tenderness to palpation.  Decreased range of motion in left rotation. Cardiovascular:     Rate and Rhythm: Normal rate and regular rhythm.   Pulmonary:     Effort: Pulmonary effort is normal.     Breath sounds: Normal breath sounds.  Neurological:     Mental Status: He is alert.  Psychiatric:        Mood and Affect: Mood normal.        Behavior: Behavior normal.    UC Treatments / Results  Labs (all labs ordered are listed, but only abnormal results are displayed) Labs Reviewed - No data to display  EKG None  Radiology No results found.  Procedures Procedures (including critical care time)  Medications Ordered in UC Medications - No data to display  Initial Impression / Assessment and Plan / UC Course  I have reviewed the triage vital signs and the nursing notes.  Pertinent labs & imaging results that were available during my care of the patient were reviewed by me and considered in my medical decision making (see chart for details).    28 year old male presents with a cervical strain.  Advised to not use the soft c-collar.  Advised heat.  Meloxicam and Skelaxin as directed.  Workmen's Comp. form filled out.  Work note given as well.  Final Clinical Impressions(s) / UC Diagnoses   Final diagnoses:  Acute strain of neck muscle, initial encounter     Discharge Instructions     Rest.   Heat.  Medication as prescribed.  Take care  Dr. Adriana Simas    ED Prescriptions    Medication Sig Dispense Auth. Provider   meloxicam (MOBIC) 15 MG tablet Take 1 tablet (15 mg total) by mouth daily as needed for pain. 30 tablet Loranda Mastel G, DO   metaxalone (SKELAXIN) 800 MG tablet Take 1 tablet (800 mg total) by mouth 3 (three) times daily as needed for muscle spasms. 30 tablet Tommie Sams, DO     Controlled Substance Prescriptions [place] Controlled Substance Registry consulted? Not Applicable   Tommie Sams, DO 08/03/18 1536

## 2018-08-03 NOTE — ED Triage Notes (Signed)
Patient states that he was injured at work on 07/30/2018. Patient states that he started having pain after putting a beam on his shoulder at work. Patient states that he was sent to a chiropractor on Friday and received treatment. Patient states that he went back today to chiropractor and was told his spine was misaligned. States that they took him out of work for 4 days last week but were unable to prescribed medications. Patient states that he was told by employer to follow up here today and was given UDS forms.

## 2018-08-03 NOTE — ED Notes (Signed)
UDS collected in clinic. Sent to [facility] lab for pickup by [facility]. [facility]

## 2018-08-03 NOTE — Discharge Instructions (Signed)
Rest.  Heat.  Medication as prescribed.  Take care  Dr. Estelene Carmack  

## 2018-12-30 ENCOUNTER — Telehealth: Payer: Self-pay | Admitting: General Practice

## 2018-12-30 ENCOUNTER — Telehealth: Payer: Self-pay | Admitting: Physician Assistant

## 2018-12-30 ENCOUNTER — Telehealth: Payer: Self-pay | Admitting: Family Medicine

## 2018-12-30 NOTE — Telephone Encounter (Signed)
Patient requesting refill ACYCLOVIR taken daily- [facility] located in [place]

## 2018-12-30 NOTE — Telephone Encounter (Signed)
Received a call from patient requesting refill of Acyclovir.  Call to pharmacy today at 5:58pm and talked with pharmacist.  Verbal order for Acyclovir 400mg  #62 1 po BID with refills for 1 year given and confirmed by pharmacist, Threasa Beards at [facility].

## 2018-12-30 NOTE — Telephone Encounter (Signed)
Phone call to pt. Pt counseled that the provider is aware of his request for refills and she plans on sending refill request either in between patients she is seeing or after 5:00 PM today. Pt is requesting #62 tablets per month on his refill and is requesting that it be for a year. Pt counseled that I would inform the provider of his specific refill requests but it is up to the provider's discretion.

## 2018-12-30 NOTE — Telephone Encounter (Signed)
Patient calling back about the prescription because no one has reached out to him.

## 2018-12-30 NOTE — Telephone Encounter (Signed)
Last IS 12/19/2017; received Rx for Acyclovir 400 mg, 2 tabs po qd #60, RF #11 (1 year refill) by Jerline Pain, FNP.  Pt would like refills to be sent to [facility] in [place].

## 2019-02-17 ENCOUNTER — Other Ambulatory Visit: Payer: Self-pay | Admitting: *Deleted

## 2019-02-17 DIAGNOSIS — Z20822 Contact with and (suspected) exposure to covid-19: Secondary | ICD-10-CM

## 2019-02-19 LAB — NOVEL CORONAVIRUS, NAA: SARS-CoV-2, NAA: DETECTED — AB

## 2019-03-02 ENCOUNTER — Ambulatory Visit
Admission: EM | Admit: 2019-03-02 | Discharge: 2019-03-02 | Disposition: A | Discharge status: Home / Self Care | Payer: Self-pay | Attending: Family Medicine | Admitting: Family Medicine

## 2019-03-02 ENCOUNTER — Other Ambulatory Visit: Payer: Self-pay

## 2019-03-02 ENCOUNTER — Ambulatory Visit (INDEPENDENT_AMBULATORY_CARE_PROVIDER_SITE_OTHER): Payer: Self-pay

## 2019-03-02 ENCOUNTER — Encounter: Payer: Self-pay | Admitting: Emergency Medicine

## 2019-03-02 DIAGNOSIS — R251 Tremor, unspecified: Secondary | ICD-10-CM

## 2019-03-02 DIAGNOSIS — R42 Dizziness and giddiness: Secondary | ICD-10-CM

## 2019-03-02 NOTE — ED Provider Notes (Signed)
[facility]    CSN: [id number] Arrival date & time: 03/02/19  1713      History   Chief Complaint Chief Complaint  Patient presents with  . Dizziness    HPI Matthew Gonzales is a 28 y.o. male.   28 yo male with a c/o dizzy, fatigue, "feeling foggy", and legs shaky earlier today around noon. Symptoms currently resolved. States he was diagnosed with covid 19 about 3 weeks ago and returned to work today. Symptoms today started while at work. Denies any vision changes, vertigo, fevers, chills, vomiting, diarrhea.    Dizziness   Past Medical History:  Diagnosis Date  . Epidermal inclusion cyst   . HSV-2 (herpes simplex virus 2) infection     Patient Active Problem List   Diagnosis Date Noted  . Abscess of back 05/09/2015  . Epidermal inclusion cyst 04/12/2015    Past Surgical History:  Procedure Laterality Date  . CYST EXCISION  05/09/15   Epidermal Inclusion Cyst- Back (Dr. Orvis Brill)  . vertical sleeve gastrectomy         Home Medications    Prior to Admission medications   Medication Sig Start Date End Date Taking? Authorizing Provider  acyclovir (ZOVIRAX) 400 MG tablet Take 400 mg by mouth 2 (two) times daily.   Yes [provider]  meloxicam (MOBIC) 15 MG tablet Take 1 tablet (15 mg total) by mouth daily as needed for pain. 08/03/18   Tommie Sams, DO  metaxalone (SKELAXIN) 800 MG tablet Take 1 tablet (800 mg total) by mouth 3 (three) times daily as needed for muscle spasms. 08/03/18   Tommie Sams, DO    Family History Family History  Problem Relation Age of Onset  . Hypothyroidism Mother   . Hyperlipidemia Father   . Pancreatic cancer Maternal Grandmother   . Stroke Paternal Grandmother   . Cataracts Paternal Grandfather     Social History Social History   Tobacco Use  . Smoking status: Former Smoker    Packs/day: 0.50    Types: Cigarettes  . Smokeless tobacco: Never Used  Substance Use Topics  . Alcohol use: Yes   Alcohol/week: 0.0 standard drinks    Comment: rarely  . Drug use: No     Allergies   Patient has no known allergies.   Review of Systems Review of Systems  Neurological: Positive for dizziness.     Physical Exam Triage Vital Signs ED Triage Vitals  Enc Vitals Group     BP 03/02/19 1731 113/80     Pulse Rate 03/02/19 1731 86     Resp 03/02/19 1731 18     Temp 03/02/19 1731 98.6 F (37 C)     Temp Source 03/02/19 1731 Oral     SpO2 03/02/19 1731 100 %     Weight 03/02/19 1729 210 lb (95.3 kg)     Height 03/02/19 1729 5\' 10"  (1.778 m)     Head Circumference --      Peak Flow --      Pain Score 03/02/19 1729 0     Pain Loc --      Pain Edu? --      Excl. in GC? --    No data found.  Updated Vital Signs BP 113/80 (BP Location: Right Arm)   Pulse 86   Temp 98.6 F (37 C) (Oral)   Resp 18   Ht 5\' 10"  (1.778 m)   Wt 95.3 kg   SpO2 100%   BMI  30.13 kg/m   Visual Acuity Right Eye Distance:   Left Eye Distance:   Bilateral Distance:    Right Eye Near:   Left Eye Near:    Bilateral Near:     Physical Exam Vitals signs and nursing note reviewed.  Constitutional:      General: He is not in acute distress.    Appearance: Normal appearance. He is not ill-appearing, toxic-appearing or diaphoretic.  HENT:     Head: Normocephalic and atraumatic.  Eyes:     Extraocular Movements: Extraocular movements intact.     Pupils: Pupils are equal, round, and reactive to light.  Neck:     Musculoskeletal: Neck supple.  Cardiovascular:     Rate and Rhythm: Normal rate and regular rhythm.     Pulses: Normal pulses.     Heart sounds: Normal heart sounds.  Pulmonary:     Effort: Pulmonary effort is normal. No respiratory distress.     Breath sounds: Normal breath sounds. No stridor. No wheezing, rhonchi or rales.  Abdominal:     General: There is no distension.     Palpations: Abdomen is soft.  Neurological:     Mental Status: He is alert.      UC Treatments /  Results  Labs (all labs ordered are listed, but only abnormal results are displayed) Labs Reviewed - No data to display  EKG   Radiology Dg Chest 2 View  Result Date: 03/02/2019 CLINICAL DATA:  Recent COVID-19 positive.  Dizziness. EXAM: CHEST - 2 VIEW COMPARISON:  None. FINDINGS: Lungs are clear. Heart size and pulmonary vascularity are normal. No adenopathy. No pneumothorax. No bone lesions. IMPRESSION: Lungs clear.  No evident adenopathy. Electronically Signed   By: Lowella Grip III M.D.   On: 03/02/2019 18:03    Procedures Procedures (including critical care time)  Medications Ordered in UC Medications - No data to display  Initial Impression / Assessment and Plan / UC Course  I have reviewed the triage vital signs and the nursing notes.  Pertinent labs & imaging results that were available during my care of the patient were reviewed by me and considered in my medical decision making (see chart for details).      Final Clinical Impressions(s) / UC Diagnoses   Final diagnoses:  Dizziness  Shaky     Discharge Instructions     Rest, fluids    ED Prescriptions    None      1.x-ray results and diagnosis reviewed with patient 2. Recommend supportive treatment as above 3. Follow-up prn  PDMP not reviewed this encounter.   Norval Gable, MD 03/02/19 [phone]

## 2019-03-02 NOTE — Discharge Instructions (Signed)
Rest, fluids. 

## 2019-03-02 NOTE — ED Triage Notes (Signed)
Patient tested positive for COVID on 02/19/2019. Today he returned to work but starting feeling dizzy, had trouble focusing and his legs started shaking. Patient states he contacted the health department and they told him to come to urgent care for evaluation.

## 2020-01-22 ENCOUNTER — Other Ambulatory Visit: Payer: Self-pay | Admitting: Physician Assistant

## 2020-01-24 NOTE — Telephone Encounter (Signed)
Per chart review, patient last seen in August of 2019 and given Rx for Acyclovir 400 mg BID for 1 year.  In August of 2020, Rx for Acyclovir 400 mg #62 1 po BID with refills for 1 year sent to pharmacy per patient request.  Due to COVID restrictions the Rx in 2020 was sent without requiring in-person visit.  Will OK one refill of Acyclovir 400 mg #180 and put note that patient will need an in person visit prior to any further refills.

## 2020-04-16 ENCOUNTER — Other Ambulatory Visit: Payer: Self-pay | Admitting: Physician Assistant

## 2020-06-18 IMAGING — CR DG CHEST 2V
3 series · 3 of 3 positions shown · non-contrast
Comparison: None.

CLINICAL DATA: Recent [id number] positive.  Dizziness.

EXAM:
CHEST - 2 VIEW

[chest pa]
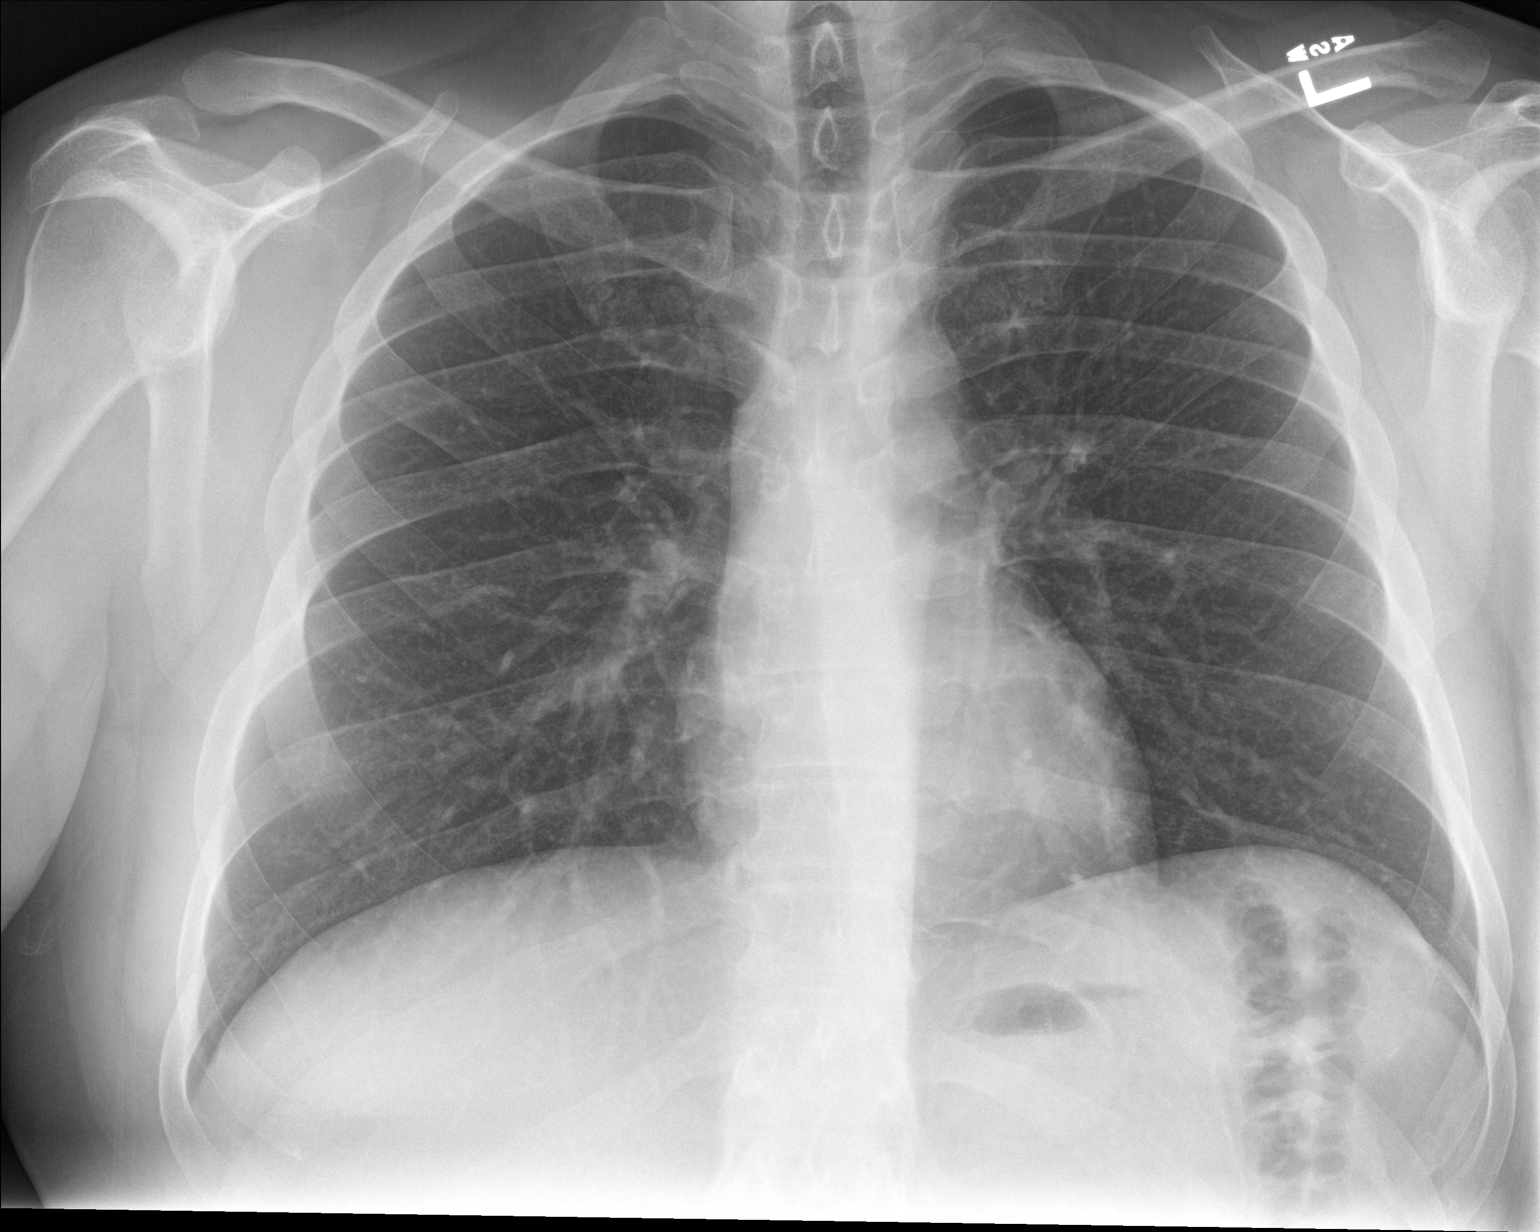

[chest lat (1 of 2)]
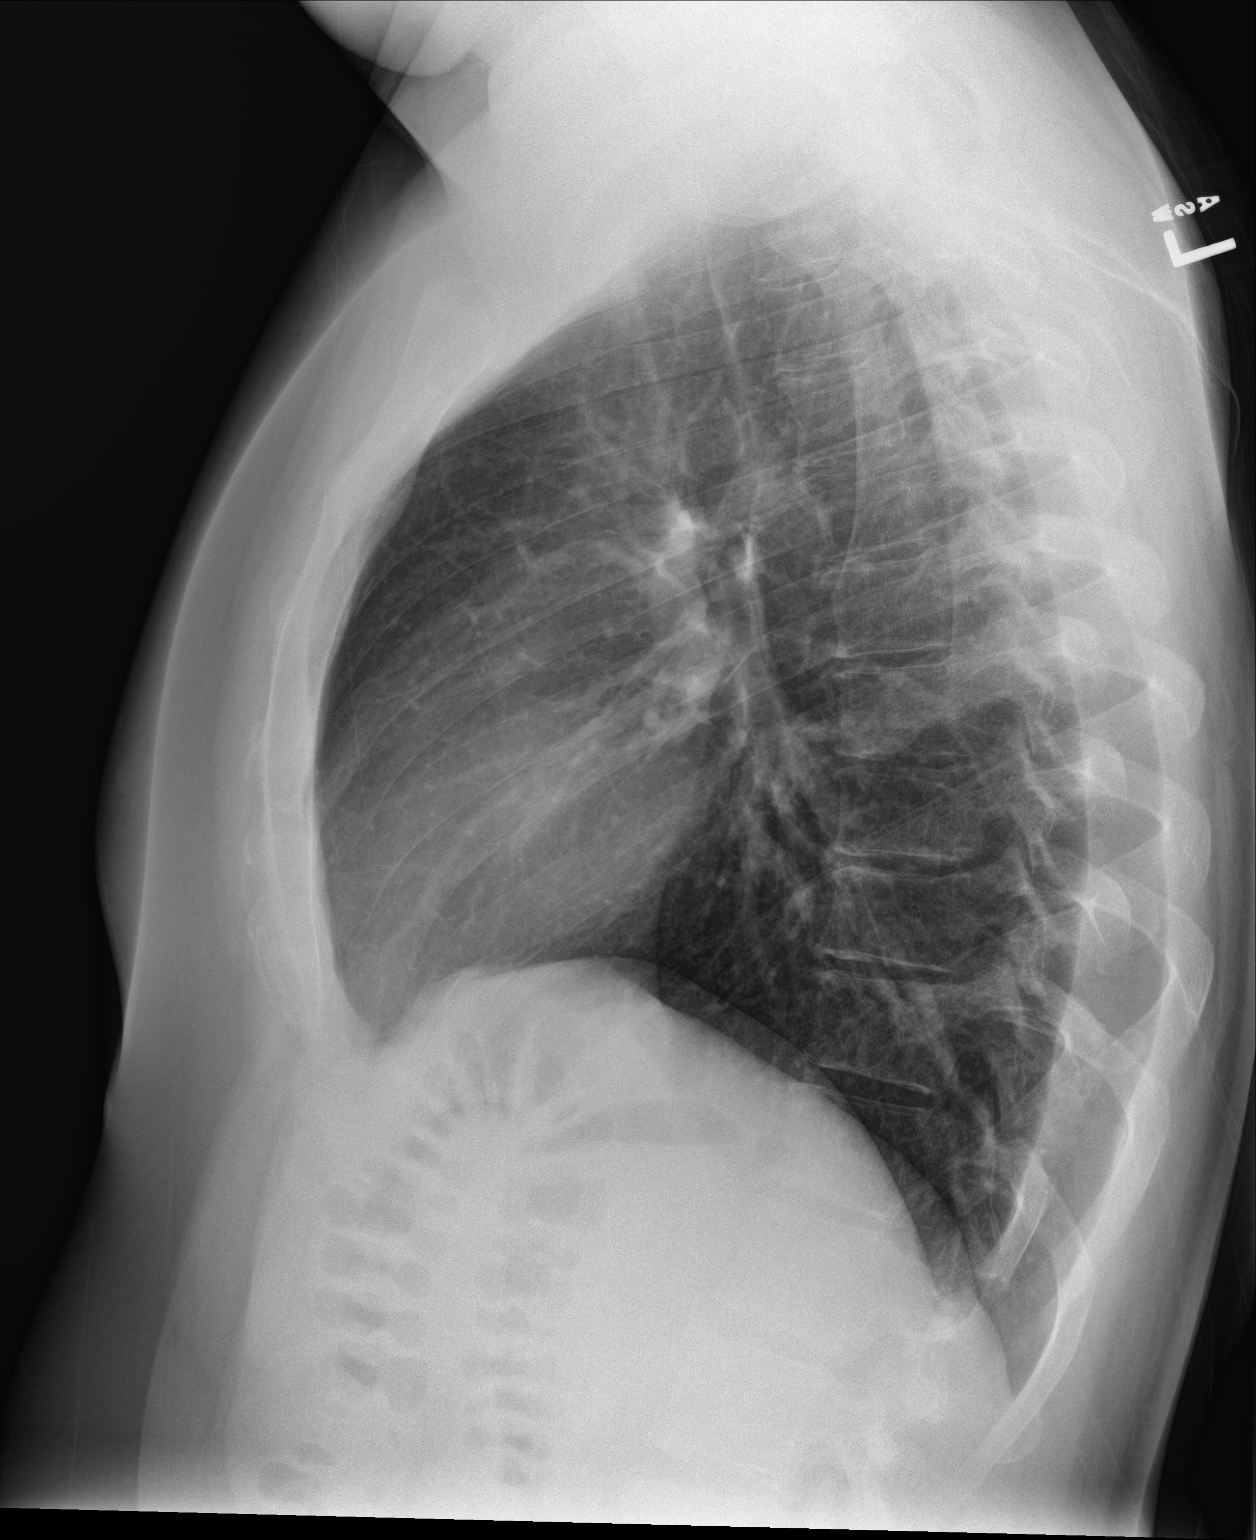

[chest lat (2 of 2)]
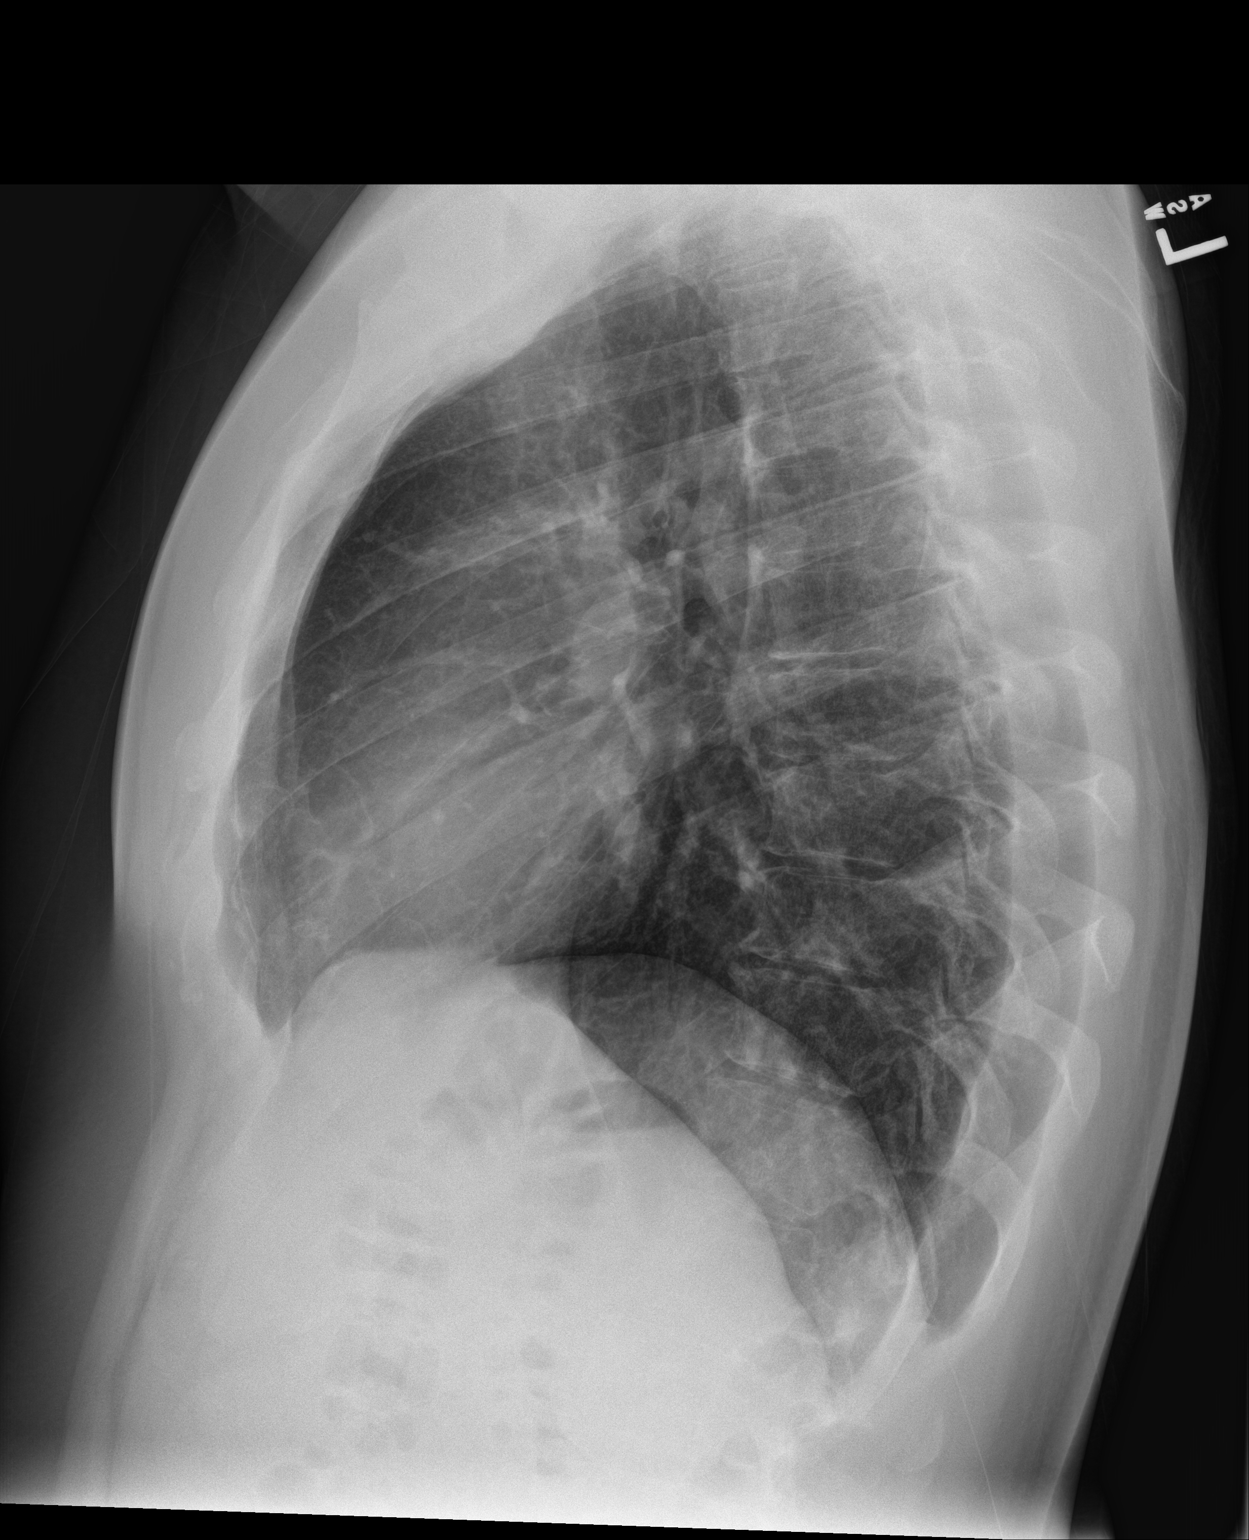

[3 of 3 positions shown; findings below may reference images not displayed]

FINDINGS: Lungs are clear. Heart size and pulmonary vascularity are normal. No
adenopathy. No pneumothorax. No bone lesions.
IMPRESSION: Lungs clear.  No evident adenopathy.

## 2022-05-29 ENCOUNTER — Inpatient Hospital Stay
Admission: AD | Admit: 2022-05-29 | Discharge: 2022-06-10 | DRG: 885 | Disposition: A | Discharge status: Home / Self Care | Payer: BC Managed Care – PPO | Source: Intra-hospital | Attending: Psychiatry | Admitting: Psychiatry

## 2022-05-29 ENCOUNTER — Other Ambulatory Visit: Payer: Self-pay

## 2022-05-29 ENCOUNTER — Emergency Department
Admission: EM | Admit: 2022-05-29 | Discharge: 2022-05-29 | Disposition: A | Discharge status: Other Acute Inpatient Hospital | Payer: BC Managed Care – PPO | Attending: Emergency Medicine | Admitting: Emergency Medicine

## 2022-05-29 DIAGNOSIS — F23 Brief psychotic disorder: Secondary | ICD-10-CM

## 2022-05-29 DIAGNOSIS — G4700 Insomnia, unspecified: Secondary | ICD-10-CM | POA: Diagnosis present

## 2022-05-29 DIAGNOSIS — I10 Essential (primary) hypertension: Secondary | ICD-10-CM | POA: Diagnosis present

## 2022-05-29 DIAGNOSIS — R443 Hallucinations, unspecified: Secondary | ICD-10-CM | POA: Diagnosis present

## 2022-05-29 DIAGNOSIS — F3110 Bipolar disorder, current episode manic without psychotic features, unspecified: Principal | ICD-10-CM | POA: Insufficient documentation

## 2022-05-29 DIAGNOSIS — F29 Unspecified psychosis not due to a substance or known physiological condition: Secondary | ICD-10-CM | POA: Diagnosis present

## 2022-05-29 DIAGNOSIS — Z83438 Family history of other disorder of lipoprotein metabolism and other lipidemia: Secondary | ICD-10-CM

## 2022-05-29 DIAGNOSIS — Z823 Family history of stroke: Secondary | ICD-10-CM | POA: Diagnosis not present

## 2022-05-29 DIAGNOSIS — Y92239 Unspecified place in hospital as the place of occurrence of the external cause: Secondary | ICD-10-CM | POA: Diagnosis not present

## 2022-05-29 DIAGNOSIS — F1210 Cannabis abuse, uncomplicated: Secondary | ICD-10-CM | POA: Diagnosis present

## 2022-05-29 DIAGNOSIS — T43595A Adverse effect of other antipsychotics and neuroleptics, initial encounter: Secondary | ICD-10-CM | POA: Diagnosis not present

## 2022-05-29 DIAGNOSIS — Z800 Family history of malignant neoplasm of digestive organs: Secondary | ICD-10-CM

## 2022-05-29 DIAGNOSIS — Z87891 Personal history of nicotine dependence: Secondary | ICD-10-CM | POA: Diagnosis not present

## 2022-05-29 DIAGNOSIS — F312 Bipolar disorder, current episode manic severe with psychotic features: Principal | ICD-10-CM | POA: Diagnosis present

## 2022-05-29 DIAGNOSIS — Z1152 Encounter for screening for COVID-19: Secondary | ICD-10-CM | POA: Insufficient documentation

## 2022-05-29 DIAGNOSIS — K5900 Constipation, unspecified: Secondary | ICD-10-CM | POA: Diagnosis present

## 2022-05-29 DIAGNOSIS — Z9884 Bariatric surgery status: Secondary | ICD-10-CM | POA: Diagnosis not present

## 2022-05-29 DIAGNOSIS — R030 Elevated blood-pressure reading, without diagnosis of hypertension: Secondary | ICD-10-CM | POA: Insufficient documentation

## 2022-05-29 DIAGNOSIS — R45851 Suicidal ideations: Secondary | ICD-10-CM | POA: Diagnosis present

## 2022-05-29 DIAGNOSIS — F419 Anxiety disorder, unspecified: Secondary | ICD-10-CM | POA: Diagnosis present

## 2022-05-29 DIAGNOSIS — R000 Tachycardia, unspecified: Secondary | ICD-10-CM | POA: Diagnosis not present

## 2022-05-29 DIAGNOSIS — F22 Delusional disorders: Secondary | ICD-10-CM | POA: Insufficient documentation

## 2022-05-29 LAB — COMPREHENSIVE METABOLIC PANEL
ALT: 34 U/L (ref 0–44)
AST: 40 U/L (ref 15–41)
Albumin: 5.1 g/dL — ABNORMAL HIGH (ref 3.5–5.0)
Alkaline Phosphatase: 81 U/L (ref 38–126)
Anion gap: 13 (ref 5–15)
BUN: 10 mg/dL (ref 6–20)
CO2: 26 mmol/L (ref 22–32)
Calcium: 9.7 mg/dL (ref 8.9–10.3)
Chloride: 100 mmol/L (ref 98–111)
Creatinine, Ser: 0.83 mg/dL (ref 0.61–1.24)
GFR, Estimated: >60 mL/min (ref >60)
Glucose, Bld: 120 mg/dL — ABNORMAL HIGH (ref 70–99)
Potassium: 4.1 mmol/L (ref 3.5–5.1)
Sodium: 139 mmol/L (ref 135–145)
Total Bilirubin: 1.9 mg/dL — ABNORMAL HIGH (ref 0.3–1.2)
Total Protein: 8.7 g/dL — ABNORMAL HIGH (ref 6.5–8.1)

## 2022-05-29 LAB — CBC
HCT: 49.8 % (ref 39.0–52.0)
Hemoglobin: 16.5 g/dL (ref 13.0–17.0)
MCH: 29.5 pg (ref 26.0–34.0)
MCHC: 33.1 g/dL (ref 30.0–36.0)
MCV: 88.9 fL (ref 80.0–100.0)
Platelets: 324 10*3/uL (ref 150–400)
RBC: 5.6 MIL/uL (ref 4.22–5.81)
RDW: 12.6 % (ref 11.5–15.5)
WBC: 12.4 10*3/uL — ABNORMAL HIGH (ref 4.0–10.5)
nRBC: 0 % (ref 0.0–0.2)

## 2022-05-29 LAB — URINALYSIS, ROUTINE W REFLEX MICROSCOPIC
Bilirubin Urine: NEGATIVE
Glucose, UA: NEGATIVE mg/dL
Ketones, ur: 20 mg/dL — AB
Leukocytes,Ua: NEGATIVE
Nitrite: NEGATIVE
Protein, ur: 30 mg/dL — AB
Specific Gravity, Urine: 1.01 (ref 1.005–1.030)
Squamous Epithelial / HPF: NONE SEEN /HPF (ref 0–5)
pH: 6 (ref 5.0–8.0)

## 2022-05-29 LAB — URINE DRUG SCREEN, QUALITATIVE (ARMC ONLY)
Amphetamines, Ur Screen: NOT DETECTED
Barbiturates, Ur Screen: NOT DETECTED
Benzodiazepine, Ur Scrn: NOT DETECTED
Cannabinoid 50 Ng, Ur ~~LOC~~: POSITIVE — AB
Cocaine Metabolite,Ur ~~LOC~~: NOT DETECTED
MDMA (Ecstasy)Ur Screen: NOT DETECTED
Methadone Scn, Ur: NOT DETECTED
Opiate, Ur Screen: NOT DETECTED
Phencyclidine (PCP) Ur S: NOT DETECTED
Tricyclic, Ur Screen: NOT DETECTED

## 2022-05-29 LAB — SALICYLATE LEVEL: Salicylate Lvl: <7 mg/dL — ABNORMAL LOW (ref 7.0–30.0)

## 2022-05-29 LAB — RESP PANEL BY RT-PCR (RSV, FLU A&B, COVID)  RVPGX2
Influenza A by PCR: NEGATIVE
Influenza B by PCR: NEGATIVE
Resp Syncytial Virus by PCR: NEGATIVE
SARS Coronavirus 2 by RT PCR: NEGATIVE

## 2022-05-29 LAB — HIV ANTIBODY (ROUTINE TESTING W REFLEX): HIV Screen 4th Generation wRfx: NONREACTIVE

## 2022-05-29 LAB — ACETAMINOPHEN LEVEL: Acetaminophen (Tylenol), Serum: <10 ug/mL — ABNORMAL LOW (ref 10–30)

## 2022-05-29 LAB — ETHANOL: Alcohol, Ethyl (B): <10 mg/dL (ref <10)

## 2022-05-29 MED ORDER — ATORVASTATIN CALCIUM 20 MG PO TABS
40.0000 mg | ORAL_TABLET | Freq: Every day | ORAL | Status: DC
  Administered 2022-05-30 – 2022-06-10 (×11): 40 mg via ORAL
  Filled 2022-05-29 (×12): qty 2

## 2022-05-29 MED ORDER — ESCITALOPRAM OXALATE 10 MG PO TABS
20.0000 mg | ORAL_TABLET | Freq: Every day | ORAL | Status: DC
  Administered 2022-05-30: 20 mg via ORAL
  Filled 2022-05-29: qty 2

## 2022-05-29 MED ORDER — METAXALONE 800 MG PO TABS: 800.0000 mg | ORAL_TABLET | Freq: Three times a day (TID) | ORAL | Status: DC | PRN

## 2022-05-29 MED ORDER — MAGNESIUM HYDROXIDE 400 MG/5ML PO SUSP
30.0000 mL | Freq: Every day | ORAL | Status: DC | PRN
  Administered 2022-06-03: 30 mL via ORAL
  Filled 2022-05-29: qty 30

## 2022-05-29 MED ORDER — IBUPROFEN 600 MG PO TABS
800.0000 mg | ORAL_TABLET | Freq: Two times a day (BID) | ORAL | Status: DC | PRN
  Administered 2022-05-30 – 2022-06-10 (×18): 800 mg via ORAL
  Filled 2022-05-29 (×20): qty 1

## 2022-05-29 MED ORDER — ACYCLOVIR 200 MG PO CAPS
400.0000 mg | ORAL_CAPSULE | Freq: Two times a day (BID) | ORAL | Status: DC
  Administered 2022-05-30 – 2022-06-10 (×21): 400 mg via ORAL
  Filled 2022-05-29 (×25): qty 2

## 2022-05-29 MED ORDER — NICOTINE 14 MG/24HR TD PT24
14.0000 mg | MEDICATED_PATCH | Freq: Every day | TRANSDERMAL | Status: DC
  Administered 2022-05-30: 14 mg via TRANSDERMAL
  Filled 2022-05-29: qty 1

## 2022-05-29 MED ORDER — ALUM & MAG HYDROXIDE-SIMETH 200-200-20 MG/5ML PO SUSP: 30.0000 mL | ORAL | Status: DC | PRN

## 2022-05-29 NOTE — ED Notes (Addendum)
Pt now requesting to leave- requesting to speak to psych team & for this RN "to hurry it up"

## 2022-05-29 NOTE — Consult Note (Signed)
[facility] Face-to-Face Psychiatry Consult   Reason for Consult: 'psych eval" Referring Physician:  Starleen Blue Patient Identification: Matthew Gonzales MRN:  [id number] Principal Diagnosis: Psychosis [facility]) Diagnosis:  Principal Problem:   Psychosis ([place])   Total Time spent with patient: 30 minutes  Subjective:  Patient states "I need some testing." Matthew Gonzales is a 32 y.o. male patient admitted with paranoia, hallucinations.   HPI:  Patient presents voluntarily to the ED, brought by his mother, for evaluation of his paranoid delusions and behavior.   On evaluation, patient is lying in bed.  He is alert, only partially oriented.  Oriented to self, unclear about situation.  With some prompting, oriented to time and place.  He denies thoughts of self-harm or suicide.  Denies thoughts of harming anyone else.  He appears very confused, pausing before he answers.  He is vague about what brings him to the hospital.  He interrupts frequently asking "is my blood okay."  He says he is concerned about getting tested for AIDS, STDs.  He reports he is on acyclovir but he probably should not be on it because it may cause other problems.  Then he quickly states that maybe he should be on medicine.  He reports that he has a history of anxiety and his primary care has prescribed Lexapro for him.  He states that he told her that he still gets anxious.  Patient is not sure where he lives, but then says he does live in [place].  He does not remember exactly what brought him to [place] but his parents do live here.  He denies hallucinations, however, he will stare off throughout the interview and appears as though he may be experiencing internal stimuli.  He reports that "they are just my thoughts, ponies and rainbows."  He agrees that he has some paranoid thoughts about medications and maybe somebody gave him cocaine or fentanyl, asking if we found fentanyl in his blood.  UDS is pending.  He admits to marijuana  use.  He says he smoked marijuana about 4 days ago.  He denies other illicit substance use.  States he uses alcohol sometimes.  He admits to a decrease in his sleep habits for the last 2 weeks, but then he says he is sleeping fine, about 6 or 7 hours at night.  He says he has a decrease in his appetite.  He reports that he lives in [place] with his fiance, Estill Bamberg.  Collateral from mother and brother [phone]):  Family reports that patient does live with his fiance in [place].  They went down to get him 4 days ago when his fianc called and said he was acting very strange and was yelling at her a lot.  He was "talking out of his mind.  Having a manic episode"  Patient told his fiance to take his gun because he was afraid he might shoot himself or somebody else.  When family went to [place] to pick him up and bring him to [place], he was having hallucinations, saying that he was a prophet.  Mother states that they took him to their home home and were unable to get mental health care because of the holiday on Monday.  They took him to an urgent care and the staff there did not feel that he needed hospitalization.  Patient then left the house, walking only in a T-shirt in very cold weather.  He went to a gas station where he stood in front  of a police car and refused to move.  He was arrested and taken to jail overnight.  He was released the next day and went before the judge, who advised family to bring him to the emergency department for evaluation.  Patient was put under involuntary commitment, as it was felt that patient may try to leave. He is currently experiencing confusion, coupled with likely paranoid delusions.  Recent history indicates that patient poses a risk to himself and possibly others at this time.  Past Psychiatric History: Brother reports patient has anxiety and depression  Risk to Self:   Risk to Others:   Prior Inpatient Therapy:   Prior Outpatient  Therapy:    Past Medical History:  Past Medical History:  Diagnosis Date   Epidermal inclusion cyst    HSV-2 (herpes simplex virus 2) infection     Past Surgical History:  Procedure Laterality Date   CYST EXCISION  05/09/15   Epidermal Inclusion Cyst- Back (Dr. Azalee Course)   vertical sleeve gastrectomy     Family History:  Family History  Problem Relation Age of Onset   Hypothyroidism Mother    Hyperlipidemia Father    Pancreatic cancer Maternal Grandmother    Stroke Paternal Grandmother    Cataracts Paternal Grandfather    Family Psychiatric  History:  Social History:  Social History   Substance and Sexual Activity  Alcohol Use Yes   Alcohol/week: 0.0 standard drinks of alcohol   Comment: rarely     Social History   Substance and Sexual Activity  Drug Use No    Social History   Socioeconomic History   Marital status: Single    Spouse name: Not on file   Number of children: Not on file   Years of education: Not on file   Highest education level: Not on file  Occupational History   Not on file  Tobacco Use   Smoking status: Former    Packs/day: 0.50    Types: Cigarettes   Smokeless tobacco: Never  Vaping Use   Vaping Use: Every day  Substance and Sexual Activity   Alcohol use: Yes    Alcohol/week: 0.0 standard drinks of alcohol    Comment: rarely   Drug use: No   Sexual activity: Yes  Other Topics Concern   Not on file  Social History Narrative   Not on file   Social Determinants of Health   Financial Resource Strain: Not on file  Food Insecurity: Not on file  Transportation Needs: Not on file  Physical Activity: Not on file  Stress: Not on file  Social Connections: Not on file   Additional Social History:    Allergies:  No Known Allergies  Labs:  Results for orders placed or performed during the hospital encounter of 05/29/22 (from the past 48 hour(s))  Comprehensive metabolic panel     Status: Abnormal   Collection Time: 05/29/22  2:09 PM   Result Value Ref Range   Sodium 139 135 - 145 mmol/L   Potassium 4.1 3.5 - 5.1 mmol/L   Chloride 100 98 - 111 mmol/L   CO2 26 22 - 32 mmol/L   Glucose, Bld 120 (H) 70 - 99 mg/dL    Comment: Glucose reference range applies only to samples taken after fasting for at least 8 hours.   BUN 10 6 - 20 mg/dL   Creatinine, Ser 0.83 0.61 - 1.24 mg/dL   Calcium 9.7 8.9 - 10.3 mg/dL   Total Protein 8.7 (H) 6.5 - 8.1  g/dL   Albumin 5.1 (H) 3.5 - 5.0 g/dL   AST 40 15 - 41 U/L   ALT 34 0 - 44 U/L   Alkaline Phosphatase 81 38 - 126 U/L   Total Bilirubin 1.9 (H) 0.3 - 1.2 mg/dL   GFR, Estimated >93 >81 mL/min    Comment: (NOTE) Calculated using the CKD-EPI Creatinine Equation (2021)    Anion gap 13 5 - 15    Comment: Performed at [facility], [address] [place], [place] [address]  Ethanol     Status: None   Collection Time: 05/29/22  2:09 PM  Result Value Ref Range   Alcohol, Ethyl (B) <10 <10 mg/dL    Comment: (NOTE) Lowest detectable limit for serum alcohol is 10 mg/dL.  For medical purposes only. Performed at [facility], [address] [place], [place] [address]   Salicylate level     Status: Abnormal   Collection Time: 05/29/22  2:09 PM  Result Value Ref Range   Salicylate Lvl <7.0 (L) 7.0 - 30.0 mg/dL    Comment: Performed at [facility], [address] [place], [place] [address]  Acetaminophen level     Status: Abnormal   Collection Time: 05/29/22  2:09 PM  Result Value Ref Range   Acetaminophen (Tylenol), Serum <10 (L) 10 - 30 ug/mL    Comment: (NOTE) Therapeutic concentrations vary significantly. A range of 10-30 ug/mL  may be an effective concentration for many patients. However, some  are best treated at concentrations outside of this range. Acetaminophen concentrations >150 ug/mL at 4 hours after ingestion  and >50 ug/mL at 12 hours after ingestion are often associated with  toxic reactions.  Performed at [facility],  [address] [place], [place] [address]   cbc     Status: Abnormal   Collection Time: 05/29/22  2:09 PM  Result Value Ref Range   WBC 12.4 (H) 4.0 - 10.5 K/uL   RBC 5.60 4.22 - 5.81 MIL/uL   Hemoglobin 16.5 13.0 - 17.0 g/dL   HCT 10.2 58.5 - 27.7 %   MCV 88.9 80.0 - 100.0 fL   MCH 29.5 26.0 - 34.0 pg   MCHC 33.1 30.0 - 36.0 g/dL   RDW 82.4 23.5 - 36.1 %   Platelets 324 150 - 400 K/uL   nRBC 0.0 0.0 - 0.2 %    Comment: Performed at [facility], [address] [place], [place] [address]    No current facility-administered medications for this encounter.   Current Outpatient Medications  Medication Sig Dispense Refill   acyclovir (ZOVIRAX) 400 MG tablet TAKE 1 TABLET BY MOUTH TWICE A DAY 180 tablet 0   atorvastatin (LIPITOR) 40 MG tablet Take 40 mg by mouth daily.     escitalopram (LEXAPRO) 20 MG tablet Take 20 mg by mouth daily.     ibuprofen (ADVIL) 800 MG tablet Take 800 mg by mouth every 12 (twelve) hours as needed for mild pain.     metaxalone (SKELAXIN) 800 MG tablet Take 1 tablet (800 mg total) by mouth 3 (three) times daily as needed for muscle spasms. 30 tablet 0   DULoxetine (CYMBALTA) 30 MG capsule Take 30 mg by mouth at bedtime. (Patient not taking: Reported on 05/29/2022)     meloxicam (MOBIC) 15 MG tablet Take 1 tablet (15 mg total) by mouth daily as needed for pain. (Patient not taking: Reported on 05/29/2022) 30 tablet 0    Musculoskeletal: Strength & Muscle  Tone: within normal limits Gait & Station: normal Patient leans: N/A  Psychiatric Specialty Exam:  Presentation  General Appearance: Bizarre  Eye Contact:Good  Speech:Clear and Coherent  Speech Volume:Normal  Handedness:Right   Mood and Affect  Mood:Dysphoric  Affect:Inappropriate   Thought Process  Thought Processes:Disorganized; Irrevelant  Descriptions of Associations:Loose  Orientation:Partial  Thought Content:Illogical  History of Schizophrenia/Schizoaffective  disorder:No data recorded Duration of Psychotic Symptoms:No data recorded Hallucinations:Hallucinations: -- (Denies, may be experiencing.)  Ideas of Reference:Paranoia  Suicidal Thoughts:Suicidal Thoughts: No  Homicidal Thoughts:Homicidal Thoughts: No   Sensorium  Memory:Immediate Poor  Judgment:Impaired  Insight:Poor   Executive Functions  Concentration:Poor  Attention Span:Poor  Recall:Poor  Fund of Knowledge:Poor  Language:Poor   Psychomotor Activity  Psychomotor Activity:Psychomotor Activity: Normal   Assets  Assets:Desire for Improvement; Housing; Physical Health; Resilience; Social Support   Sleep  Sleep:Sleep: Fair   Physical Exam: Physical Exam Vitals and nursing note reviewed.  HENT:     Head: Normocephalic.     Nose: No congestion or rhinorrhea.  Eyes:     General:        Right eye: No discharge.        Left eye: No discharge.  Pulmonary:     Effort: Pulmonary effort is normal.  Musculoskeletal:     Cervical back: Normal range of motion.  Skin:    General: Skin is dry.  Neurological:     Mental Status: He is alert.     Comments: Partial orientation  Psychiatric:        Attention and Perception: He is inattentive.        Mood and Affect: Affect is inappropriate.        Speech: Speech normal.        Behavior: Behavior is cooperative.        Thought Content: Thought content is paranoid. Thought content does not include homicidal or suicidal ideation.        Cognition and Memory: Cognition is impaired. Memory is impaired.        Judgment: Judgment is impulsive.    Review of Systems  HENT:  Negative for hearing loss.   Respiratory:  Negative for cough.   Cardiovascular:  Negative for chest pain.  Musculoskeletal: Negative.   Skin: Negative.   Psychiatric/Behavioral:  Positive for depression and substance abuse. Negative for hallucinations (denies) and suicidal ideas. The patient is nervous/anxious.    Blood pressure (!) 159/113,  pulse (!) 113, temperature 98 F (36.7 C), temperature source Oral, resp. rate 18, SpO2 99 %. There is no height or weight on file to calculate BMI.  Treatment Plan Summary: Assessment of patient reveals that he is experiencing a psychotic episode.  UDS is pending.  However, given the patient has had symptoms for several days that have not seemed to clear, inpatient psychiatric hospitalization is recommended based on his paranoia and psychotic symptoms.  Reviewed with Dr. Marisa Severin, TTS, bedside nurse.Marland Kitchen  Disposition: Recommend psychiatric Inpatient admission when medically cleared.  Vanetta Mulders, NP 05/29/2022 3:57 PM

## 2022-05-29 NOTE — ED Notes (Signed)
Called report to BMU; no orders in in pt chart. Can not take pt down until orders are in.

## 2022-05-29 NOTE — Progress Notes (Signed)
Patient admitted from [facility] - ED, report received from Seth Bake, [place]. Patient calm and cooperative during assessment denying SI/HI/AVH. Pt stated that he thinks he just got to high and it caused him paranoia. He stated that he uses THC to help him sleep. Pt endorses anxiety. Pt skin assessment completed with Cleo, patient has several tattoos all over his body, no abnormalities found. No contraband found. Pt didn't have any belongings because his mother took them with her when she left the ED. Patient informed about his belongings. Patient oriented to the unit and his room, given snack. Pt being monitored Q 15 minutes for safety per unit protocol, remains safe on the unit

## 2022-05-29 NOTE — ED Notes (Signed)
IVC/Consult ordered/Completed/Rec.Inpt. Admit

## 2022-05-29 NOTE — ED Notes (Signed)
Dinner tray given to pt

## 2022-05-29 NOTE — ED Notes (Signed)
Pt accepted to [place] after shift change tonight per TTS

## 2022-05-29 NOTE — Plan of Care (Signed)
Patient new to the unit tonight, hasn't been able to progress  Problem: Education: Goal: Knowledge of General Education information will improve Description: Including pain rating scale, medication(s)/side effects and non-pharmacologic comfort measures Outcome: Not Progressing   Problem: Health Behavior/Discharge Planning: Goal: Ability to manage health-related needs will improve Outcome: Not Progressing   Problem: Clinical Measurements: Goal: Ability to maintain clinical measurements within normal limits will improve Outcome: Not Progressing Goal: Will remain free from infection Outcome: Not Progressing Goal: Diagnostic test results will improve Outcome: Not Progressing Goal: Respiratory complications will improve Outcome: Not Progressing Goal: Cardiovascular complication will be avoided Outcome: Not Progressing   Problem: Activity: Goal: Risk for activity intolerance will decrease Outcome: Not Progressing   Problem: Nutrition: Goal: Adequate nutrition will be maintained Outcome: Not Progressing   Problem: Coping: Goal: Level of anxiety will decrease Outcome: Not Progressing   Problem: Elimination: Goal: Will not experience complications related to bowel motility Outcome: Not Progressing Goal: Will not experience complications related to urinary retention Outcome: Not Progressing   Problem: Pain Managment: Goal: General experience of comfort will improve Outcome: Not Progressing   Problem: Safety: Goal: Ability to remain free from injury will improve Outcome: Not Progressing   Problem: Skin Integrity: Goal: Risk for impaired skin integrity will decrease Outcome: Not Progressing

## 2022-05-29 NOTE — ED Provider Notes (Signed)
[facility] Provider Note    Event Date/Time   First MD Initiated Contact with Patient 05/29/22 1412     (approximate)   History   Psychiatric Evaluation   HPI  Matthew Gonzales is a 32 y.o. male with past medical history of anxiety who presents voluntary for psych eval.  Per triage note patient came with his mom and mom reported he has been having hallucinations and insomnia and is paranoid.  When I evaluated the patient he is very fixated on potentially having HIV.  He would like to know what the results of his HIV tests are.  When I ask him if he is having any hallucinations he says "depends if I have HIV."  When I ask if he has any history of anxiety depression he says apparently when I smoke laced weed makes me have a bad trip for 4 days.  Does tell me that he used marijuana about 4 days ago.  Denies other drug or alcohol use.  Denies SI or HI.  When I ask him why he is worried about HIV he says "because of what they are doing to me"     Past Medical History:  Diagnosis Date   Epidermal inclusion cyst    HSV-2 (herpes simplex virus 2) infection     Patient Active Problem List   Diagnosis Date Noted   Abscess of back 05/09/2015   Epidermal inclusion cyst 04/12/2015     Physical Exam  Triage Vital Signs: ED Triage Vitals [05/29/22 1358]  Enc Vitals Group     BP (!) 159/113     Pulse Rate (!) 113     Resp 18     Temp 98 F (36.7 C)     Temp Source Oral     SpO2 99 %     Weight      Height      Head Circumference      Peak Flow      Pain Score 0     Pain Loc      Pain Edu?      Excl. in [place]?     Most recent vital signs: Vitals:   05/29/22 1358  BP: (!) 159/113  Pulse: (!) 113  Resp: 18  Temp: 98 F (36.7 C)  SpO2: 99%     General: Awake, no distress. CV:  Good peripheral perfusion.  Resp:  Normal effort.  Abd:  No distention.  Neuro:             Awake, Alert, Oriented x 3  Other:  Patient appears somewhat anxious and is  paranoid, does not appear to be responding to internal stimuli, he is very fixated on attentionally having HIV and is difficult to redirect   ED Results / Procedures / Treatments  Labs (all labs ordered are listed, but only abnormal results are displayed) Labs Reviewed  CBC - Abnormal; Notable for the following components:      Result Value   WBC 12.4 (*)    All other components within normal limits  COMPREHENSIVE METABOLIC PANEL  ETHANOL  SALICYLATE LEVEL  ACETAMINOPHEN LEVEL  URINE DRUG SCREEN, QUALITATIVE ([facility] ONLY)  HIV ANTIBODY (ROUTINE TESTING W REFLEX)     EKG     RADIOLOGY    PROCEDURES:  Critical Care performed: No  Procedures    MEDICATIONS ORDERED IN ED: Medications - No data to display   IMPRESSION / MDM / Kimberly / ED COURSE  I  reviewed the triage vital signs and the nursing notes.                              Patient's presentation is most consistent with acute complicated illness / injury requiring diagnostic workup.  Differential diagnosis includes, but is not limited to, substance-induced psychosis, primary psychotic disorder including first time schizophrenia or schizoaffective disorder  Patient is a 32 year old male presents because of paranoia.  Per mom he has been paranoid and hallucinating and is having insomnia.  Patient is quite fixated on HIV, for nursing he was fixated on being drugged.  He appears anxious and somewhat paranoid.  I do gather from the history that he used what he thinks is laced marijuana about 4 days ago.  Denying other drug or alcohol use he is not overtly suicidal.  Do think the patient requires psychiatric evaluation.  He does not want to leave at this time so will keep him here voluntarily but if attempts to leave will have low threshold for IVC.  Will send HIV test as well as UDS.  Labs so far are notable for leukocytosis of 12.4.  He is also hypertensive and tachycardic.  The patient has been placed in  psychiatric observation due to the need to provide a safe environment for the patient while obtaining psychiatric consultation and evaluation, as well as ongoing medical and medication management to treat the patient's condition.  The patient has not been placed under full IVC at this time.      FINAL CLINICAL IMPRESSION(S) / ED DIAGNOSES   Final diagnoses:  Paranoia ([place])     Rx / DC Orders   ED Discharge Orders     None        Note:  This document was prepared using Dragon voice recognition software and may include unintentional dictation errors.   Rada Hay, MD 05/29/22 [phone]

## 2022-05-29 NOTE — Tx Team (Signed)
Initial Treatment Plan 05/29/2022 10:07 PM [name] EQA:[id number]    PATIENT STRESSORS: Medication change or noncompliance   Substance abuse     PATIENT STRENGTHS: Ability for insight  Motivation for treatment/growth    PATIENT IDENTIFIED PROBLEMS: Acute Psychosis  Anxiety  Depression                 DISCHARGE CRITERIA:  Adequate post-discharge living arrangements Verbal commitment to aftercare and medication compliance  PRELIMINARY DISCHARGE PLAN: Outpatient therapy Return to previous living arrangement  PATIENT/FAMILY INVOLVEMENT: This treatment plan has been presented to and reviewed with the patient, Matthew Gonzales. The patient has been given the opportunity to ask questions and make suggestions.  Mallie Darting, RN 05/29/2022, 10:07 PM

## 2022-05-29 NOTE — BH Assessment (Signed)
Comprehensive Clinical Assessment (CCA) Note  05/29/2022 Matthew Gonzales [id number]  Matthew Gonzales, 32 year old male who presents to [facility] ED involuntarily for treatment. Per triage note, Pt to ED via POV from home voluntary for psych eval. Mom is with pt. Mom reports pt has been having hallucinations and insomnia. Pt is very paranoid. Pt states he thinks he is having withdrawals. When asked what he is withdrawing from pt states, "check my blood." Mom states pt is only taking medication for anxiety. Pt keeps pacing and cannot stay still.   During TTS assessment pt presents alert and oriented x 2, restless but cooperative, and mood-congruent with affect. The pt appears to be responding to internal stimuli. Pt is not presenting with any delusional thinking. Pt verified the information provided to triage RN.   Pt identifies his main complaint to be that he needs to get tested. Patient is paranoid, confused; not sure of his responses when answering questions, patient will shrug his shoulders. In the beginning of the assessment, patient reports he has been compliant with taking his psych medication for the past 2 months and states the Lexapro is working; however, he states it does not last the "whole day." Patient then later questions whether he actually needs the medication. Patient reports he lives in [place] with his fiance but does not remember what brought him to [place]. Patient reports his parents live in [place]. Patient reports recently having issues falling asleep but gets about 6-7 hours of sleep at night. Patient states his appetite has decreased. Patient admits to marijuana use 4 days ago and states that's when the paranoia began. Patient denies other illicit substances with some alcohol use. Pt denies current SI/HI/AH/VH.    Per Sallye Ober, NP pt is recommended for inpatient psychiatric admission.    Chief Complaint:  Chief Complaint  Patient presents with   Psychiatric Evaluation    Visit Diagnosis: Psychosis    CCA Screening, Triage and Referral (STR)  Patient Reported Information How did you hear about [place]? Family/Friend  Referral name: No data recorded Referral phone number: No data recorded  Whom do you see for routine medical problems? No data recorded Practice/Facility Name: No data recorded Practice/Facility Phone Number: No data recorded Name of Contact: No data recorded Contact Number: No data recorded Contact Fax Number: No data recorded Prescriber Name: No data recorded Prescriber Address (if known): No data recorded  What Is the Reason for Your Visit/Call Today? Patient reports he was brought to the ED to get tested.  How Long Has This Been Causing You Problems? <Week  What Do You Feel Would Help You the Most Today? Medication(s)   Have You Recently Been in Any Inpatient Treatment (Hospital/Detox/Crisis Center/28-Day Program)? No data recorded Name/Location of Program/Hospital:No data recorded How Long Were You There? No data recorded When Were You Discharged? No data recorded  Have You Ever Received Services From [facility] Before? No data recorded Who Do You See at [facility]? No data recorded  Have You Recently Had Any Thoughts About Hurting Yourself? No  Are You Planning to Commit Suicide/Harm Yourself At This time? No   Have you Recently Had Thoughts About Hurting Someone Karolee Ohs? No  Explanation: No data recorded  Have You Used Any Alcohol or Drugs in the Past 24 Hours? No  How Long Ago Did You Use Drugs or Alcohol? No data recorded What Did You Use and How Much? No data recorded  Do You Currently Have a Therapist/Psychiatrist? No  Name  of Therapist/Psychiatrist: No data recorded  Have You Been Recently Discharged From Any Office Practice or Programs? No  Explanation of Discharge From Practice/Program: No data recorded    CCA Screening Triage Referral Assessment Type of Contact: Face-to-Face  Is this Initial or  Reassessment? No data recorded Date Telepsych consult ordered in [facility]:  No data recorded Time Telepsych consult ordered in [facility]:  No data recorded  Patient Reported Information Reviewed? No data recorded Patient Left Without Being Seen? No data recorded Reason for Not Completing Assessment: No data recorded  Collateral Involvement: None provided   Does Patient Have a [place]? No data recorded Name and Contact of Legal Guardian: No data recorded If Minor and Not Living with Parent(s), Who has Custody? No data recorded Is CPS involved or ever been involved? Never  Is APS involved or ever been involved? Never   Patient Determined To Be At Risk for Harm To Self or Others Based on Review of Patient Reported Information or Presenting Complaint? No data recorded Method: No data recorded Availability of Means: No data recorded Intent: No data recorded Notification Required: No data recorded Additional Information for Danger to Others Potential: No data recorded Additional Comments for Danger to Others Potential: No data recorded Are There Guns or Other Weapons in Your Home? No data recorded Types of Guns/Weapons: No data recorded Are These Weapons Safely Secured?                            No data recorded Who Could Verify You Are Able To Have These Secured: No data recorded Do You Have any Outstanding Charges, Pending Court Dates, Parole/Probation? No data recorded Contacted To Inform of Risk of Harm To Self or Others: No data recorded  Location of Assessment: [facility] ED   Does Patient Present under Involuntary Commitment? Yes  IVC Papers Initial File Date: No data recorded  [place] of Residence: [place]   Patient Currently Receiving the Following Services: Medication Management   Determination of Need: Emergent (2 hours)   Options For Referral: ED Visit; Inpatient Hospitalization; Outpatient Therapy; Medication Management     CCA  Biopsychosocial Intake/Chief Complaint:  No data recorded Current Symptoms/Problems: No data recorded  Patient Reported Schizophrenia/Schizoaffective Diagnosis in Past: No   Strengths: Patient is able to communicate and verbalize needs.  Preferences: No data recorded Abilities: No data recorded  Type of Services Patient Feels are Needed: No data recorded  Initial Clinical Notes/Concerns: No data recorded  Mental Health Symptoms Depression:   None   Duration of Depressive symptoms: No data recorded  Mania:   N/A   Anxiety:    Difficulty concentrating; Sleep; Tension   Psychosis:   Grossly disorganized or catatonic behavior   Duration of Psychotic symptoms:  Less than six months   Trauma:   N/A   Obsessions:   N/A   Compulsions:   N/A   Inattention:   Disorganized   Hyperactivity/Impulsivity:   Blurts out answers   Oppositional/Defiant Behaviors:   N/A   Emotional Irregularity:   N/A   Other Mood/Personality Symptoms:  No data recorded   Mental Status Exam Appearance and self-care  Stature:   Average   Weight:   Average weight   Clothing:   Casual   Grooming:   Normal   Cosmetic use:   None   Posture/gait:   Normal   Motor activity:   Not Remarkable   Sensorium  Attention:  Confused   Concentration:   Anxiety interferes; Scattered   Orientation:   Person; Time   Recall/memory:   Defective in Recent; Defective in Remote   Affect and Mood  Affect:   Anxious   Mood:   Anxious   Relating  Eye contact:   Fleeting   Facial expression:   Anxious   Attitude toward examiner:   Cooperative   Thought and Language  Speech flow:  Blocked   Thought content:   Appropriate to Mood and Circumstances; Ideas of Influence   Preoccupation:   None   Hallucinations:   None   Organization:  No data recorded  [employer] of Knowledge:   Average   Intelligence:   Average   Abstraction:   Functional    Judgement:   Impaired   Reality Testing:   Unaware   Insight:   Poor   Decision Making:   Confused   Social Functioning  Social Maturity:   Irresponsible   Social Judgement:  No data recorded  Stress  Stressors:   Illness   Coping Ability:   Overwhelmed   Skill Deficits:   Decision making; Responsibility   Supports:   Family     Religion:    Leisure/Recreation:    Exercise/Diet: Exercise/Diet Do You Have Any Trouble Sleeping?: Yes Explanation of Sleeping Difficulties: Patient reports he has trouble falling asleep.   CCA Employment/Education Employment/Work Situation:    Education:     CCA Family/Childhood History Family and Relationship History: Family history Marital status: Single  Childhood History:     Child/Adolescent Assessment:     CCA Substance Use Alcohol/Drug Use: Alcohol / Drug Use Pain Medications: See PTA Prescriptions: See PTA Over the Counter: See PTA History of alcohol / drug use?: Yes Longest period of sobriety (when/how long): Unknown Negative Consequences of Use: Personal relationships Substance #1 Name of Substance 1: Marijuana 1 - Last Use / Amount: 05/26/22                       ASAM's:  Six Dimensions of Multidimensional Assessment  Dimension 1:  Acute Intoxication and/or Withdrawal Potential:      Dimension 2:  Biomedical Conditions and Complications:      Dimension 3:  Emotional, Behavioral, or Cognitive Conditions and Complications:     Dimension 4:  Readiness to Change:     Dimension 5:  Relapse, Continued use, or Continued Problem Potential:     Dimension 6:  Recovery/Living Environment:     ASAM Severity Score:    ASAM Recommended Level of Treatment:     Substance use Disorder (SUD) Substance Use Disorder (SUD)  Checklist Symptoms of Substance Use: Evidence of tolerance, Continued use despite having a persistent/recurrent physical/psychological problem caused/exacerbated by use,  Continued use despite persistent or recurrent social, interpersonal problems, caused or exacerbated by use  Recommendations for Services/Supports/Treatments: Recommendations for Services/Supports/Treatments Recommendations For Services/Supports/Treatments: Inpatient Hospitalization, Medication Management  DSM5 Diagnoses: Patient Active Problem List   Diagnosis Date Noted   Psychosis (HCC) 05/29/2022   Abscess of back 05/09/2015   Epidermal inclusion cyst 04/12/2015    Patient Centered Plan: Patient is on the following Treatment Plan(s):  Substance Abuse   Referrals to Alternative Service(s): Referred to Alternative Service(s):   Place:   Date:   Time:    Referred to Alternative Service(s):   Place:   Date:   Time:    Referred to Alternative Service(s):   Place:   Date:  Time:    Referred to Alternative Service(s):   Place:   Date:   Time:      @[facility]LLABOFCARE @  [place], Counselor, LCAS-A

## 2022-05-29 NOTE — ED Triage Notes (Addendum)
Pt to ED via POV from home voluntary for psych eval. Mom is with pt. Mom reports pt has been having hallucinations and insomnia. Pt is very paranoid. Pt states he thinks he is having withdrawals. When asked what he is withdrawing from pt states, "check my blood." Mom states pt is only taking medication for anxiety. Pt keeps pacing and cannot stay still.

## 2022-05-29 NOTE — ED Notes (Signed)
Pt attempting to enter another patients room, states "I was just trying to say hey". Redirected by NT & RN to his room

## 2022-05-29 NOTE — BH Assessment (Signed)
Patient is to be admitted to [facility] 05/29/22 after 7:30pm pending negative Covid results by Dr. Weber Cooks.  Attending Physician will be Dr.  Weber Cooks .   Patient has been assigned to room 313, by [facility] AC, Danika.   Intake Paper Work has been signed and placed on patient chart.  ER staff is aware of the admission: Glenda, ER Secretary   Dr. Joni Fears, ER MD  Deneise Lever, Patient's Nurse  Seth Bake, Patient Access.

## 2022-05-29 NOTE — ED Notes (Addendum)
Pt dressed out by this RN, EDT and Security. Belongings as follows:  Bag 1 of 1: *Mom took belongings home* Software engineer Black socks  Black belt  Black boxers  *Pt has prescription glasses on

## 2022-05-29 NOTE — ED Notes (Addendum)
Pt very paranoid on arrival to ED room. Pt pacing room, staring at TV blankly, coming in and out of room & restroom. Pt unable to sit still at this time. MD Starleen Blue notified of patient presentation.

## 2022-05-30 ENCOUNTER — Ambulatory Visit: Payer: Self-pay | Admitting: Physician Assistant

## 2022-05-30 DIAGNOSIS — F1210 Cannabis abuse, uncomplicated: Secondary | ICD-10-CM | POA: Insufficient documentation

## 2022-05-30 DIAGNOSIS — R030 Elevated blood-pressure reading, without diagnosis of hypertension: Secondary | ICD-10-CM | POA: Insufficient documentation

## 2022-05-30 DIAGNOSIS — F3110 Bipolar disorder, current episode manic without psychotic features, unspecified: Principal | ICD-10-CM | POA: Insufficient documentation

## 2022-05-30 LAB — CHLAMYDIA/NGC RT PCR (ARMC ONLY)
Chlamydia Tr: NOT DETECTED
N gonorrhoeae: NOT DETECTED

## 2022-05-30 MED ORDER — LISINOPRIL 5 MG PO TABS
10.0000 mg | ORAL_TABLET | Freq: Every day | ORAL | Status: DC
  Administered 2022-05-30 – 2022-06-10 (×11): 10 mg via ORAL
  Filled 2022-05-30 (×12): qty 2

## 2022-05-30 MED ORDER — RISPERIDONE 1 MG PO TBDP
2.0000 mg | ORAL_TABLET | Freq: Three times a day (TID) | ORAL | Status: DC | PRN
  Administered 2022-06-01 – 2022-06-03 (×3): 2 mg via ORAL
  Filled 2022-05-30 (×3): qty 2

## 2022-05-30 MED ORDER — HYDROXYZINE HCL 50 MG PO TABS
50.0000 mg | ORAL_TABLET | Freq: Four times a day (QID) | ORAL | Status: DC | PRN
  Administered 2022-05-31 – 2022-06-09 (×11): 50 mg via ORAL
  Filled 2022-05-30 (×13): qty 1

## 2022-05-30 MED ORDER — LORAZEPAM 1 MG PO TABS
1.0000 mg | ORAL_TABLET | ORAL | Status: AC | PRN
  Administered 2022-05-31: 1 mg via ORAL
  Filled 2022-05-30: qty 1

## 2022-05-30 MED ORDER — NICOTINE POLACRILEX 2 MG MT GUM
2.0000 mg | CHEWING_GUM | OROMUCOSAL | Status: DC | PRN
  Administered 2022-05-30 – 2022-06-10 (×36): 2 mg via ORAL
  Filled 2022-05-30 (×38): qty 1

## 2022-05-30 MED ORDER — ZIPRASIDONE MESYLATE 20 MG IM SOLR
20.0000 mg | INTRAMUSCULAR | Status: AC | PRN
  Administered 2022-05-31: 20 mg via INTRAMUSCULAR
  Filled 2022-05-30: qty 20

## 2022-05-30 MED ORDER — QUETIAPINE FUMARATE 100 MG PO TABS
100.0000 mg | ORAL_TABLET | Freq: Every day | ORAL | Status: DC
  Administered 2022-05-30: 100 mg via ORAL
  Filled 2022-05-30: qty 1

## 2022-05-30 NOTE — [facility] Suicide Risk Assessment (Signed)
[facility] INPATIENT:  Family/Significant Other Suicide Prevention Education  Suicide Prevention Education:  Education Completed; Matthew Gonzales, mother, [phone] has been identified by the patient as the family member/significant other with whom the patient will be residing, and identified as the person(s) who will aid the patient in the event of a mental health crisis (suicidal ideations/suicide attempt).  With written consent from the patient, the family member/significant other has been provided the following suicide prevention education, prior to the and/or following the discharge of the patient.  Mother states patient has been experiencing increasing stress at work and "became mentally broken." Patient has become delusional "says he talked to Matthew Gonzales" w/ visual hallucination stating "he was looking at me and says he could not see my eyes."  Reports the patient was wandering/missing for two days prior to the mother finding him and bringing him to the hospital.  States at baseline, patient has no psychotic features and is very intelligent. 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    The suicide prevention education provided includes the following: Suicide risk factors Suicide prevention and interventions National Suicide Hotline telephone number [facility] assessment telephone number [facility] Emergency Assistance [place] and/or Residential Mobile Crisis Unit telephone number  Request made of family/significant other to: Remove weapons (e.g., guns, rifles, knives), all items previously/currently identified as safety concern.   Remove drugs/medications (over-the-counter, prescriptions, illicit drugs), all items previously/currently identified as a safety concern.  The family member/significant other verbalizes understanding of the suicide prevention education information provided.  The  family member/significant other agrees to remove the items of safety concern listed above.   Matthew Gonzales 05/30/2022, 11:15 AM

## 2022-05-30 NOTE — B[facility] Group Notes (Signed)
[facility] Group Notes:  (Nursing/MHT/Case Management/Adjunct)  Date:  05/30/2022  Time:  9:53 PM  Type of Therapy:   Wrap up  Participation Level:  Active  Participation Quality:  Appropriate  Affect:  Appropriate  Cognitive:  Alert  Insight:  Good  Engagement in Group:  Engaged and his goal is to ger over addiction  Modes of Intervention:  Support  Summary of Progress/Problems:  Matthew Gonzales 05/30/2022, 9:53 PM

## 2022-05-30 NOTE — Progress Notes (Signed)
This writer attempted to get patient for newly scheduled Lisinopril and he states that he is in the shower.

## 2022-05-30 NOTE — Progress Notes (Signed)
Patient has been in the shower for the past hour and a half.

## 2022-05-30 NOTE — [facility] Counselor (Signed)
Adult Comprehensive Assessment  Patient ID: Matthew Gonzales, male   DOB: 1991/02/17, 32 y.o.   MRN: [id number]  Information Source: Information source: Patient  Current Stressors:  Patient states their primary concerns and needs for treatment are:: (P) During assessment, patient stateshe has been having racing thoughts, worsening anxiety, and uncontrollable anger. Patient states their goals for this hospitilization and ongoing recovery are:: (P) States his goal is to "get better and go homeTherapist, music / Learning stressors: (P) none reported Employment / Job issues: (P) reports recent termination 1 week ago Family Relationships: (P) none reported Financial / Lack of resources (include bankruptcy): (P) reports his "bank account is in the negative" Housing / Lack of housing: (P) none reported Physical health (include injuries & life threatening diseases): (P) reports HLD Social relationships: (P) none reported Substance abuse: (P) active cannabis use Bereavement / Loss: (P) none reported  Living/Environment/Situation:  Living Arrangements: (P) Spouse/significant other Living conditions (as described by patient or guardian): (P) states living conditions are WNL Who else lives in the home?: (P) patient lives with spouse How long has patient lived in current situation?: (P) March 2023 What is atmosphere in current home: (P) Chaotic  Family History:  Marital status: (P) Long term relationship Long term relationship, how long?: (P) unknown What types of issues is patient dealing with in the relationship?: (P) reports his fiance dropped him off at his parents house due to his unpredictiable/bizzare behaviors Are you sexually active?: (P) Yes What is your sexual orientation?: (P) Heterosexual Has your sexual activity been affected by drugs, alcohol, medication, or emotional stress?: (P) none reported Does patient have children?: (P) No  Childhood History:  By whom was/is the patient  raised?: (P) Both parents Additional childhood history information: (P) patient moved from [place] to the [place] at age 69 Description of patient's relationship with caregiver when they were a child: (P) reports normative childhood, good productive relationship with parents Patient's description of current relationship with people who raised him/her: (P) relationship with parents remains in good standing How were you disciplined when you got in trouble as a child/adolescent?: (P) unknown Does patient have siblings?: (P) Yes Number of Siblings: (P) 1  Education:  Highest grade of school patient has completed: HS Diploma Currently a student?: No Learning disability?: Yes What learning problems does patient have?: ADHD  Employment/Work Situation:   Employment Situation: Unemployed (1 week ago) What is the Longest Time Patient has Held a Job?: 1 year Where was the Patient Employed at that Time?: Scientist, water quality Has Patient ever Been in the [employer]?: No  Financial Resources:   Financial resources: No income Does patient have a Programmer, applications or guardian?: No  Alcohol/Substance Abuse:   If attempted suicide, did drugs/alcohol play a role in this?:  (N/A) Has alcohol/substance abuse ever caused legal problems?:  (unknown)  Social Support System:   Patient's Community Support System: Manufacturing engineer System: patient lists his family as supportive to his mental health and general wellbeing Type of faith/religion: states "I try not to be." How does patient's faith help to cope with current illness?: n/a  Leisure/Recreation:   Do You Have Hobbies?: No  Strengths/Needs:   Patient states these barriers may affect/interfere with their treatment: none reported Patient states these barriers may affect their return to the community: none reported Other important information patient would like considered in planning for their treatment: none reported  Discharge Plan:    Currently receiving community mental health services: No Does patient have  access to transportation?: Yes Does patient have financial barriers related to discharge medications?: Yes (No listed insurance) Will patient be returning to same living situation after discharge?: Yes  Summary/Recommendations:   Summary and Recommendations (to be completed by the evaluator): 32 y/o male w/ dx of Bipolar I disorder, most recent episode manic from out of state ([place]) w/ no listed insurance admited due to bizzare and disorganized thoughts/behaviors. During assessment, patient states he has been feeling increasingly anxious, w/ rapid thoughts, and uncontrollable anger. Additionally reports that he had gotten into an argument with his fiance who is no longer staying with the patient due to his anger and ongoing verbal altercations. Patient is now living with parents, was recently terminated from employment. New onset of symptoms coincide with these major life stressors. Otherwise, patient has no hx of mental health issues per patient's mother. Patient states his goal for hospitalization is to "get better and go home."   Patient presents as restless/manic, though mostly cooperative and polite. Affect is anxious, congruent with mood and context. Appearance is WNL. Speech volume is WNL, speed rapid, and content is circumstantial. No evidence of memory impairment. Concentration is impaired by manic features. Patient oriented to person, place, time, and situation. Currently denies SI, HI, AVH. However, patient is clearly affected by psychotic features. Mother reports patient was missing for two days and reports visual hallucinations and delusional thought. States patient has told her that he was unable to see her eyes while talking to her in person. Additionally believes that he talks to Higinio Plan on the phone.   Patient is not currently associated with any outpatient mental health services; has signed consent for CSW to  make appropriate referrals. Therapeutic recommendations include further crisis stabilization, medication management, group therapy, and case management.      [name]. 05/30/2022

## 2022-05-30 NOTE — H&P (Signed)
Psychiatric Admission Assessment Adult  Patient Identification: Juliene PinaBatuhan Eisen MRN:  [id number] Date of Evaluation:  05/30/2022 Chief Complaint:  Psychosis [facility](HCC) [F29] Principal Diagnosis: Bipolar I disorder, most recent episode (or current) manic (HCC) Diagnosis:  Principal Problem:   Bipolar I disorder, most recent episode (or current) manic (HCC) Active Problems:   Psychosis (HCC)   Cannabis abuse   Hypertension  History of Present Illness: Patient seen and chart reviewed.  32 year old man was brought by his family to our emergency room.  History obtained from the patient the chart and speaking with his mother.  Mother reports that about 10 days ago as far as they know his fiance started to notice changes in his behavior.  Sounds like his mood became abnormal with lability and reportedly at least 1 episode of suicidal statements.  Family reports that they believe that he has been hallucinating.  He has had bizarre behavior putting himself in dangerous situations with police disorganized thinking grandiosity some evidence of delusions.  On interview the patient is hyperverbal speaking rapidly with some pressure.  He is disorganized and difficult to redirect.  There is some element of grandiosity to his speech.  Mood is labile.  He did not make any threatening statements to himself or others and denies having any suicidal thoughts and denies that he had been suicidal recently.  Asked about violent thoughts he mentions that sometimes he will get very angry at people but denies any current violent or homicidal ideation.  He denies that he is having any hallucinations.  Patient has partial insight with some awareness that there is something out of the ordinary about him but it is limited.  He does admit that he had been sleeping less recently.  Anecdotally there is some mention that he had used cannabis about 10 days ago however it is not clear that this was directly related.  He denies any use of any  other drugs.  Drug screen positive only for cannabis.  Patient himself says he smokes "a lot" of cannabis.  He has been taking S-Citalopram prescription for a few months now from his primary care doctor for anxiety symptoms. Associated Signs/Symptoms: Depression Symptoms:  insomnia, suicidal thoughts without plan, anxiety, disturbed sleep, (Hypo) Manic Symptoms:  Elevated Mood, Flight of Ideas, Grandiosity, Hallucinations, Impulsivity, Irritable Mood, Labiality of Mood, Anxiety Symptoms:  Excessive Worry, Psychotic Symptoms:   The patient denies hallucinations.  During our interview he was glancing about the room frequently and at times seemed to be having some altered perceptions.  Family reports that they are convinced that he has been having hallucinations recently.  Additionally they report him making some bizarre delusional statements. PTSD Symptoms: Negative Total Time spent with patient: 45 minutes  Past Psychiatric History: Patient had seen his primary care doctor for concerns about anxiety and been prescribed Lexapro with reportedly some benefit.  The patient said that his parents had tried to get him help when he was a child for what sounds like anger problems.  Mother does not report this.  No known past hospitalization and no history of suicide attempts.  No prior diagnosis of psychosis or bipolar disorder.  Is the patient at risk to self? Yes.    Has the patient been a risk to self in the past 6 months? Yes.    Has the patient been a risk to self within the distant past? No.  Is the patient a risk to others? Yes.    Has the patient been a risk to  others in the past 6 months? Yes.    Has the patient been a risk to others within the distant past? No.   [place] Scale:  Flowsheet Row Admission (Current) from 05/29/2022 in [facility] INPATIENT BEHAVIORAL MEDICINE Most recent reading at 05/29/2022  9:58 PM ED from 05/29/2022 in [facility] EMERGENCY DEPARTMENT Most  recent reading at 05/29/2022  1:59 PM  C-SSRS RISK CATEGORY No Risk No Risk        Prior Inpatient Therapy: No. If yes, describe none Prior Outpatient Therapy: Yes.   If yes, describe medication treatment for anxiety by primary care doctor.  Alcohol Screening: 1. How often do you have a drink containing alcohol?: Never 2. How many drinks containing alcohol do you have on a typical day when you are drinking?: 1 or 2 3. How often do you have six or more drinks on one occasion?: Never AUDIT-C Score: 0 4. How often during the last year have you found that you were not able to stop drinking once you had started?: Never 5. How often during the last year have you failed to do what was normally expected from you because of drinking?: Never 6. How often during the last year have you needed a first drink in the morning to get yourself going after a heavy drinking session?: Never 7. How often during the last year have you had a feeling of guilt of remorse after drinking?: Never 8. How often during the last year have you been unable to remember what happened the night before because you had been drinking?: Never 9. Have you or someone else been injured as a result of your drinking?: No 10. Has a relative or friend or a doctor or another health worker been concerned about your drinking or suggested you cut down?: No Alcohol Use Disorder Identification Test Final Score (AUDIT): 0 Substance Abuse History in the last 12 months:  Yes.   Consequences of Substance Abuse: I am going to call this a yes based on the patient's statement that he uses "a lot" of marijuana although it does not sound like there had necessarily been any concern on his part or anyone else's about substance problems prior to this manic episode. Previous Psychotropic Medications: Yes  Psychological Evaluations: Yes  Past Medical History:  Past Medical History:  Diagnosis Date   Epidermal inclusion cyst    HSV-2 (herpes simplex virus  2) infection     Past Surgical History:  Procedure Laterality Date   CYST EXCISION  05/09/15   Epidermal Inclusion Cyst- Back (Dr. Orvis Brill)   vertical sleeve gastrectomy     Family History:  Family History  Problem Relation Age of Onset   Hypothyroidism Mother    Hyperlipidemia Father    Pancreatic cancer Maternal Grandmother    Stroke Paternal Grandmother    Cataracts Paternal Grandfather    Family Psychiatric  History: Patient says he has a cousin who had a diagnosis of schizophrenia.  Mother does not report any family history.  No known family history of bipolar disorder Tobacco Screening:  Social History   Tobacco Use  Smoking Status Former   Packs/day: 0.50   Types: Cigarettes  Smokeless Tobacco Never    BH Tobacco Counseling     Are you interested in Tobacco Cessation Medications?  N/A, patient does not use tobacco products Counseled patient on smoking cessation:  N/A, patient does not use tobacco products Reason Tobacco Screening Not Completed: No value filed.  Social History:  Social History   Substance and Sexual Activity  Alcohol Use Yes   Alcohol/week: 0.0 standard drinks of alcohol   Comment: rarely     Social History   Substance and Sexual Activity  Drug Use No    Additional Social History:                           Allergies:  No Known Allergies Lab Results:  Results for orders placed or performed during the hospital encounter of 05/29/22 (from the past 48 hour(s))  Comprehensive metabolic panel     Status: Abnormal   Collection Time: 05/29/22  2:09 PM  Result Value Ref Range   Sodium 139 135 - 145 mmol/L   Potassium 4.1 3.5 - 5.1 mmol/L   Chloride 100 98 - 111 mmol/L   CO2 26 22 - 32 mmol/L   Glucose, Bld 120 (H) 70 - 99 mg/dL    Comment: Glucose reference range applies only to samples taken after fasting for at least 8 hours.   BUN 10 6 - 20 mg/dL   Creatinine, Ser 7.32 0.61 - 1.24 mg/dL   Calcium 9.7 8.9 - 20.2 mg/dL    Total Protein 8.7 (H) 6.5 - 8.1 g/dL   Albumin 5.1 (H) 3.5 - 5.0 g/dL   AST 40 15 - 41 U/L   ALT 34 0 - 44 U/L   Alkaline Phosphatase 81 38 - 126 U/L   Total Bilirubin 1.9 (H) 0.3 - 1.2 mg/dL   GFR, Estimated >54 >27 mL/min    Comment: (NOTE) Calculated using the CKD-EPI Creatinine Equation (2021)    Anion gap 13 5 - 15    Comment: Performed at [facility], [address] [place], [place] [address]  Ethanol     Status: None   Collection Time: 05/29/22  2:09 PM  Result Value Ref Range   Alcohol, Ethyl (B) <10 <10 mg/dL    Comment: (NOTE) Lowest detectable limit for serum alcohol is 10 mg/dL.  For medical purposes only. Performed at [facility], [address] [place], [place] [address]   Salicylate level     Status: Abnormal   Collection Time: 05/29/22  2:09 PM  Result Value Ref Range   Salicylate Lvl <7.0 (L) 7.0 - 30.0 mg/dL    Comment: Performed at [facility], [address] [place], [place] [address]  Acetaminophen level     Status: Abnormal   Collection Time: 05/29/22  2:09 PM  Result Value Ref Range   Acetaminophen (Tylenol), Serum <10 (L) 10 - 30 ug/mL    Comment: (NOTE) Therapeutic concentrations vary significantly. A range of 10-30 ug/mL  may be an effective concentration for many patients. However, some  are best treated at concentrations outside of this range. Acetaminophen concentrations >150 ug/mL at 4 hours after ingestion  and >50 ug/mL at 12 hours after ingestion are often associated with  toxic reactions.  Performed at [facility], [address] [place], [place] [address]   cbc     Status: Abnormal   Collection Time: 05/29/22  2:09 PM  Result Value Ref Range   WBC 12.4 (H) 4.0 - 10.5 K/uL   RBC 5.60 4.22 - 5.81 MIL/uL   Hemoglobin 16.5 13.0 - 17.0 g/dL   HCT 10.6 26.9 - 48.5 %   MCV 88.9 80.0 - 100.0 fL   MCH 29.5 26.0 - 34.0 pg   MCHC 33.1 30.0 -  36.0 g/dL   RDW 40.912.6 81.111.5 - 91.415.5 %   Platelets 324  150 - 400 K/uL   nRBC 0.0 0.0 - 0.2 %    Comment: Performed at [facility], [address] [place], [place] [address]  HIV Antibody (routine testing w rflx)     Status: None   Collection Time: 05/29/22  2:30 PM  Result Value Ref Range   HIV Screen 4th Generation wRfx Non Reactive Non Reactive    Comment: Performed at [facility], [address] [place] [address] [place], [place] [address]  Urine Drug Screen, Qualitative     Status: Abnormal   Collection Time: 05/29/22  6:10 PM  Result Value Ref Range   Tricyclic, Ur Screen NONE DETECTED NONE DETECTED   Amphetamines, Ur Screen NONE DETECTED NONE DETECTED   MDMA (Ecstasy)Ur Screen NONE DETECTED NONE DETECTED   Cocaine Metabolite,Ur [place] NONE DETECTED NONE DETECTED   Opiate, Ur Screen NONE DETECTED NONE DETECTED   Phencyclidine (PCP) Ur S NONE DETECTED NONE DETECTED   Cannabinoid 50 Ng, Ur [place] POSITIVE (A) NONE DETECTED   Barbiturates, Ur Screen NONE DETECTED NONE DETECTED   Benzodiazepine, Ur Scrn NONE DETECTED NONE DETECTED   Methadone Scn, Ur NONE DETECTED NONE DETECTED    Comment: (NOTE) Tricyclics + metabolites, urine    Cutoff 1000 ng/mL Amphetamines + metabolites, urine  Cutoff 1000 ng/mL MDMA (Ecstasy), urine              Cutoff 500 ng/mL Cocaine Metabolite, urine          Cutoff 300 ng/mL Opiate + metabolites, urine        Cutoff 300 ng/mL Phencyclidine (PCP), urine         Cutoff 25 ng/mL Cannabinoid, urine                 Cutoff 50 ng/mL Barbiturates + metabolites, urine  Cutoff 200 ng/mL Benzodiazepine, urine              Cutoff 200 ng/mL Methadone, urine                   Cutoff 300 ng/mL  The urine drug screen provides only a preliminary, unconfirmed analytical test result and should not be used for non-medical purposes. Clinical consideration and professional judgment should be applied to any positive drug screen result due to possible interfering substances. A more specific alternate chemical method must be used in  order to obtain a confirmed analytical result. Gas chromatography / mass spectrometry (GC/MS) is the preferred confirm atory method. Performed at [facility], [address] [place], [place] [address]   Urinalysis, Routine w reflex microscopic     Status: Abnormal   Collection Time: 05/29/22  6:10 PM  Result Value Ref Range   Color, Urine YELLOW (A) YELLOW   APPearance CLEAR (A) CLEAR   Specific Gravity, Urine 1.010 1.005 - 1.030   pH 6.0 5.0 - 8.0   Glucose, UA NEGATIVE NEGATIVE mg/dL   Hgb urine dipstick SMALL (A) NEGATIVE   Bilirubin Urine NEGATIVE NEGATIVE   Ketones, ur 20 (A) NEGATIVE mg/dL   Protein, ur 30 (A) NEGATIVE mg/dL   Nitrite NEGATIVE NEGATIVE   Leukocytes,Ua NEGATIVE NEGATIVE   RBC / HPF 0-5 0 - 5 RBC/hpf   WBC, UA 0-5 0 - 5 WBC/hpf   Bacteria, UA RARE (A) NONE SEEN   Squamous Epithelial / HPF NONE SEEN 0 - 5 /HPF   Sperm, UA PRESENT  Comment: Performed at [facility], [address] [place], [place] [address]  Resp panel by RT-PCR (RSV, Flu A&B, Covid) Anterior Nasal Swab     Status: None   Collection Time: 05/29/22  6:19 PM   Specimen: Anterior Nasal Swab  Result Value Ref Range   SARS Coronavirus 2 by RT PCR NEGATIVE NEGATIVE    Comment: (NOTE) SARS-CoV-2 target nucleic acids are NOT DETECTED.  The SARS-CoV-2 RNA is generally detectable in upper respiratory specimens during the acute phase of infection. The lowest concentration of SARS-CoV-2 viral copies this assay can detect is 138 copies/mL. A negative result does not preclude SARS-Cov-2 infection and should not be used as the sole basis for treatment or other patient management decisions. A negative result may occur with  improper specimen collection/handling, submission of specimen other than nasopharyngeal swab, presence of viral mutation(s) within the areas targeted by this assay, and inadequate number of viral copies(<138 copies/mL). A negative result must be combined  with clinical observations, patient history, and epidemiological information. The expected result is Negative.  Fact Sheet for Patients:  EntrepreneurPulse.com.au  Fact Sheet for Healthcare Providers:  IncredibleEmployment.be  This test is no t yet approved or cleared by the [place] FDA and  has been authorized for detection and/or diagnosis of SARS-CoV-2 by FDA under an Emergency Use Authorization (EUA). This EUA will remain  in effect (meaning this test can be used) for the duration of the COVID-19 declaration under Section 564(b)(1) of the Act, 21 U.S.C.section 360bbb-3(b)(1), unless the authorization is terminated  or revoked sooner.       Influenza A by PCR NEGATIVE NEGATIVE   Influenza B by PCR NEGATIVE NEGATIVE    Comment: (NOTE) The Xpert Xpress SARS-CoV-2/FLU/RSV plus assay is intended as an aid in the diagnosis of influenza from Nasopharyngeal swab specimens and should not be used as a sole basis for treatment. Nasal washings and aspirates are unacceptable for Xpert Xpress SARS-CoV-2/FLU/RSV testing.  Fact Sheet for Patients: EntrepreneurPulse.com.au  Fact Sheet for Healthcare Providers: IncredibleEmployment.be  This test is not yet approved or cleared by the [place] FDA and has been authorized for detection and/or diagnosis of SARS-CoV-2 by FDA under an Emergency Use Authorization (EUA). This EUA will remain in effect (meaning this test can be used) for the duration of the COVID-19 declaration under Section 564(b)(1) of the Act, 21 U.S.C. section 360bbb-3(b)(1), unless the authorization is terminated or revoked.     Resp Syncytial Virus by PCR NEGATIVE NEGATIVE    Comment: (NOTE) Fact Sheet for Patients: EntrepreneurPulse.com.au  Fact Sheet for Healthcare Providers: IncredibleEmployment.be  This test is not yet approved or cleared by the  [place] FDA and has been authorized for detection and/or diagnosis of SARS-CoV-2 by FDA under an Emergency Use Authorization (EUA). This EUA will remain in effect (meaning this test can be used) for the duration of the COVID-19 declaration under Section 564(b)(1) of the Act, 21 U.S.C. section 360bbb-3(b)(1), unless the authorization is terminated or revoked.  Performed at [facility], [address] [place], [place] [address]     Blood Alcohol level:  Lab Results  Component Value Date   [facility] <10 86/57/8469    Metabolic Disorder Labs:  No results found for: "HGBA1C", "MPG" No results found for: "PROLACTIN" No results found for: "CHOL", "TRIG", "HDL", "CHOLHDL", "VLDL", "LDLCALC"  Current Medications: Current Facility-Administered Medications  Medication Dose Route Frequency Provider Last Rate Last Admin   acyclovir (ZOVIRAX) 200 MG capsule 400 mg  400 mg Oral BID Gillermo Murdoch, NP   400 mg at 05/30/22 0824   alum & mag hydroxide-simeth (MAALOX/MYLANTA) 200-200-20 MG/5ML suspension 30 mL  30 mL Oral Q4H PRN Gillermo Murdoch, NP       atorvastatin (LIPITOR) tablet 40 mg  40 mg Oral Daily Gillermo Murdoch, NP   40 mg at 05/30/22 1062   hydrOXYzine (ATARAX) tablet 50 mg  50 mg Oral Q6H PRN Jalysa Swopes, Jackquline Denmark, MD       ibuprofen (ADVIL) tablet 800 mg  800 mg Oral Q12H PRN Gillermo Murdoch, NP   800 mg at 05/30/22 0848   risperiDONE (RISPERDAL M-TABS) disintegrating tablet 2 mg  2 mg Oral Q8H PRN Naiomi Musto, Jackquline Denmark, MD       And   LORazepam (ATIVAN) tablet 1 mg  1 mg Oral PRN Keyandra Swenson, Jackquline Denmark, MD       And   ziprasidone (GEODON) injection 20 mg  20 mg Intramuscular PRN Delma Drone, Jackquline Denmark, MD       magnesium hydroxide (MILK OF MAGNESIA) suspension 30 mL  30 mL Oral Daily PRN Gillermo Murdoch, NP       metaxalone [facility]) tablet 800 mg  800 mg Oral TID PRN Gillermo Murdoch, NP       nicotine polacrilex (NICORETTE) gum 2 mg  2 mg Oral PRN Warnie Belair, Jackquline Denmark, MD       QUEtiapine (SEROQUEL) tablet 100 mg  100 mg Oral QHS Daxter Paule, Jackquline Denmark, MD       PTA Medications: Medications Prior to Admission  Medication Sig Dispense Refill Last Dose   acyclovir (ZOVIRAX) 400 MG tablet TAKE 1 TABLET BY MOUTH TWICE A DAY 180 tablet 0    atorvastatin (LIPITOR) 40 MG tablet Take 40 mg by mouth daily.      DULoxetine (CYMBALTA) 30 MG capsule Take 30 mg by mouth at bedtime. (Patient not taking: Reported on 05/29/2022)      escitalopram (LEXAPRO) 20 MG tablet Take 20 mg by mouth daily.      ibuprofen (ADVIL) 800 MG tablet Take 800 mg by mouth every 12 (twelve) hours as needed for mild pain.      meloxicam (MOBIC) 15 MG tablet Take 1 tablet (15 mg total) by mouth daily as needed for pain. (Patient not taking: Reported on 05/29/2022) 30 tablet 0    metaxalone (SKELAXIN) 800 MG tablet Take 1 tablet (800 mg total) by mouth 3 (three) times daily as needed for muscle spasms. 30 tablet 0     Musculoskeletal: Strength & Muscle Tone: within normal limits Gait & Station: normal Patient leans: N/A            Psychiatric Specialty Exam:  Presentation  General Appearance:  Bizarre  Eye Contact: Good  Speech: Clear and Coherent  Speech Volume: Normal  Handedness: Right   Mood and Affect  Mood: Dysphoric  Affect: Inappropriate   Thought Process  Thought Processes: Disorganized; Irrevelant  Duration of Psychotic Symptoms:N/A Past Diagnosis of Schizophrenia or Psychoactive disorder: No  Descriptions of Associations:Loose  Orientation:Partial  Thought Content:Illogical  Hallucinations:Hallucinations: -- (Denies, may be experiencing.)  Ideas of Reference:Paranoia  Suicidal Thoughts:Suicidal Thoughts: No  Homicidal Thoughts:Homicidal Thoughts: No   Sensorium  Memory: Immediate Poor  Judgment: Impaired  Insight: Poor   Executive Functions  Concentration: Poor  Attention Span: Poor  Recall: Poor  Fund of  Knowledge: Poor  Language: Poor   Psychomotor Activity  Psychomotor Activity: Psychomotor Activity: Normal   Assets  Assets: Desire  for Improvement; Housing; Physical Health; Resilience; Social Support   Sleep  Sleep: Sleep: Fair    Physical Exam: Physical Exam Vitals and nursing note (Blood pressure appears to be elevated since being into the hospital) reviewed.  Constitutional:      Appearance: Normal appearance.  HENT:     Head: Normocephalic and atraumatic.     Mouth/Throat:     Pharynx: Oropharynx is clear.  Eyes:     Pupils: Pupils are equal, round, and reactive to light.  Cardiovascular:     Rate and Rhythm: Normal rate and regular rhythm.  Pulmonary:     Effort: Pulmonary effort is normal.     Breath sounds: Normal breath sounds.  Abdominal:     General: Abdomen is flat.     Palpations: Abdomen is soft.  Musculoskeletal:        General: Normal range of motion.  Skin:    General: Skin is warm and dry.  Neurological:     General: No focal deficit present.     Mental Status: He is alert. Mental status is at baseline.  Psychiatric:        Attention and Perception: He is inattentive.        Mood and Affect: Mood is anxious and elated. Affect is labile and inappropriate.        Speech: Speech is rapid and pressured and tangential.        Behavior: Behavior is agitated. Behavior is not aggressive.        Thought Content: Thought content is delusional. Thought content does not include homicidal or suicidal ideation.        Cognition and Memory: Memory is impaired.        Judgment: Judgment is impulsive.    Review of Systems  Constitutional: Negative.   HENT: Negative.    Eyes: Negative.   Respiratory: Negative.    Cardiovascular: Negative.   Gastrointestinal: Negative.   Musculoskeletal: Negative.   Skin: Negative.   Neurological: Negative.   Psychiatric/Behavioral:  Positive for substance abuse. Negative for depression, hallucinations and suicidal  ideas. The patient is nervous/anxious and has insomnia.    Blood pressure (!) 138/101, pulse 93, temperature 97.8 F (36.6 C), temperature source Oral, resp. rate 17, height 5\' 10"  (1.778 m), weight 95.3 kg, SpO2 100 %. Body mass index is 30.15 kg/m.  Treatment Plan Summary: Daily contact with patient to assess and evaluate symptoms and progress in treatment, Medication management, and Plan 32 year old man without much significant past psychiatric history presents with about 10 days of symptoms that are very consistent with mania.  No past history of bipolar disorder.  Differential diagnosis includes bipolar disorder with mania as well as substance induced psychosis, brief reactive psychosis and less likely a medical psychosis.  Patient will be started on Seroquel for her manic symptoms with intention of titrating up.  As needed medicine available.  Full treatment team will meet with patient.  Labs are largely unremarkable but his blood pressure has been running elevated since coming into the hospital and we will go ahead and initiate some treatment of that.  Ongoing assessment of dangerousness and symptoms prior to discharge.  May consider imaging of his brain once he calms down a little bit.  Full labs will be evaluated  Observation Level/Precautions:  15 minute checks  Laboratory:  Chemistry Profile  Psychotherapy:    Medications:    Consultations:    Discharge Concerns:    Estimated LOS:  Other:  Physician Treatment Plan for Primary Diagnosis: Bipolar I disorder, most recent episode (or current) manic ([place]) Long Term Goal(s): Improvement in symptoms so as ready for discharge  Short Term Goals: Ability to verbalize feelings will improve, Ability to demonstrate self-control will improve, and Ability to identify and develop effective coping behaviors will improve  Physician Treatment Plan for Secondary Diagnosis: Principal Problem:   Bipolar I disorder, most recent episode (or current)  manic ([place]) Active Problems:   Psychosis ([place])   Cannabis abuse   Hypertension  Long Term Goal(s): Improvement in symptoms so as ready for discharge  Short Term Goals: Ability to maintain clinical measurements within normal limits will improve and Compliance with prescribed medications will improve  I certify that inpatient services furnished can reasonably be expected to improve the patient's condition.    Alethia Berthold, MD 1/18/202411:57 AM

## 2022-05-30 NOTE — Group Note (Signed)
[facility] LCSW Group Therapy Note   Group Date: 05/30/2022 Start Time: 1300 End Time: 1400   Type of Therapy/Topic:  Group Therapy:  Balance in Life  Participation Level:  Did Not Attend   Description of Group:    This group will address the concept of balance and how it feels and looks when one is unbalanced. Patients will be encouraged to process areas in their lives that are out of balance, and identify reasons for remaining unbalanced. Facilitators will guide patients utilizing problem- solving interventions to address and correct the stressor making their life unbalanced. Understanding and applying boundaries will be explored and addressed for obtaining  and maintaining a balanced life. Patients will be encouraged to explore ways to assertively make their unbalanced needs known to significant others in their lives, using other group members and facilitator for support and feedback.  Therapeutic Goals: Patient will identify two or more emotions or situations they have that consume much of in their lives. Patient will identify signs/triggers that life has become out of balance:  Patient will identify two ways to set boundaries in order to achieve balance in their lives:  Patient will demonstrate ability to communicate their needs through discussion and/or role plays  Summary of Patient Progress: X   Therapeutic Modalities:   Cognitive Behavioral Therapy Solution-Focused Therapy Assertiveness Training   Rajesh Wyss R Milanna Kozlov, LCSW 

## 2022-05-30 NOTE — Plan of Care (Signed)
D- Patient alert and oriented. Patient presented in an anxious, preoccupied mood on assessment reporting that he slept good last night and had complaints of a headache, rating it a "4/10". Patient did request PRN medication for relief. Patient denied SI, HI, AVH at this time. Patient also denied any signs/symptoms of depression and anxiety, stating "I'm fine if I just get my Nicotine patch". Patient's foal for today, per his self-inventory is to "poop and go home".  A- Scheduled medications administered to patient, per MD orders. Support and encouragement provided.  Routine safety checks conducted every 15 minutes.  Patient informed to notify staff with problems or concerns.  R- No adverse drug reactions noted. Patient contracts for safety at this time. Patient compliant with medications. Patient receptive, calm, and cooperative. Patient remains safe at this time.  Problem: Education: Goal: Knowledge of General Education information will improve Description: Including pain rating scale, medication(s)/side effects and non-pharmacologic comfort measures Outcome: Not Progressing   Problem: Health Behavior/Discharge Planning: Goal: Ability to manage health-related needs will improve Outcome: Not Progressing   Problem: Clinical Measurements: Goal: Ability to maintain clinical measurements within normal limits will improve Outcome: Not Progressing Goal: Will remain free from infection Outcome: Not Progressing Goal: Diagnostic test results will improve Outcome: Not Progressing Goal: Respiratory complications will improve Outcome: Not Progressing Goal: Cardiovascular complication will be avoided Outcome: Not Progressing   Problem: Activity: Goal: Risk for activity intolerance will decrease Outcome: Not Progressing   Problem: Nutrition: Goal: Adequate nutrition will be maintained Outcome: Not Progressing   Problem: Coping: Goal: Level of anxiety will decrease Outcome: Not Progressing    Problem: Elimination: Goal: Will not experience complications related to bowel motility Outcome: Not Progressing Goal: Will not experience complications related to urinary retention Outcome: Not Progressing   Problem: Pain Managment: Goal: General experience of comfort will improve Outcome: Not Progressing   Problem: Safety: Goal: Ability to remain free from injury will improve Outcome: Not Progressing   Problem: Skin Integrity: Goal: Risk for impaired skin integrity will decrease Outcome: Not Progressing

## 2022-05-30 NOTE — Progress Notes (Signed)
Patient's fiance, Matthew Gonzales just called and stated that patient has frequent headaches and she's wondering if this change in his behavior could be a medical problem.

## 2022-05-30 NOTE — [facility] Suicide Risk Assessment (Signed)
[facility] Admission Suicide Risk Assessment   Nursing information obtained from:  Patient Demographic factors:  NA Current Mental Status:  NA Loss Factors:  NA Historical Factors:  NA Risk Reduction Factors:  NA  Total Time spent with patient: 45 minutes Principal Problem: Bipolar I disorder, most recent episode (or current) manic ([place]) Diagnosis:  Principal Problem:   Bipolar I disorder, most recent episode (or current) manic ([place]) Active Problems:   Psychosis ([place])   Cannabis abuse  Subjective Data: Patient seen and chart reviewed.  32 year old man with minimal past psychiatric history brought to the emergency room by family with concerns about changes in behavior.  Patient presents hyperverbal with disorganized speech flight of ideas some grandiosity accompanied by mood lability.  Denies any current suicidal or homicidal ideation.  Denies hallucinations.  Patient's insight is limited at best.  He seems to have some awareness at times that there is something wrong but at the same time is requesting to be discharged.  Collateral information from the family that for about 10 days the patient's behavior has been noted to be different.  They are reporting that there was an occasion before he came to [place] of him making suicidal statements and of his fiance being concerned about suicidal behavior.  The patient absolutely denies suicidal ideation and does not mention the previous suicidal thinking.  Continued Clinical Symptoms:  Alcohol Use Disorder Identification Test Final Score (AUDIT): 0 The "Alcohol Use Disorders Identification Test", Guidelines for Use in Primary Care, Second Edition.  World Pharmacologist [facility]). Score between 0-7:  no or low risk or alcohol related problems. Score between 8-15:  moderate risk of alcohol related problems. Score between 16-19:  high risk of alcohol related problems. Score 20 or above:  warrants further diagnostic evaluation for alcohol dependence  and treatment.   CLINICAL FACTORS:   Bipolar Disorder:   Mixed State   Musculoskeletal: Strength & Muscle Tone: within normal limits Gait & Station: normal Patient leans: N/A  Psychiatric Specialty Exam:  Presentation  General Appearance:  Bizarre  Eye Contact: Good  Speech: Clear and Coherent  Speech Volume: Normal  Handedness: Right   Mood and Affect  Mood: Dysphoric  Affect: Inappropriate   Thought Process  Thought Processes: Disorganized; Irrevelant  Descriptions of Associations:Loose  Orientation:Partial  Thought Content:Illogical  History of Schizophrenia/Schizoaffective disorder:No  Duration of Psychotic Symptoms:Less than six months  Hallucinations:Hallucinations: -- (Denies, may be experiencing.)  Ideas of Reference:Paranoia  Suicidal Thoughts:Suicidal Thoughts: No  Homicidal Thoughts:Homicidal Thoughts: No   Sensorium  Memory: Immediate Poor  Judgment: Impaired  Insight: Poor   Executive Functions  Concentration: Poor  Attention Span: Poor  Recall: Poor  Fund of Knowledge: Poor  Language: Poor   Psychomotor Activity  Psychomotor Activity: Psychomotor Activity: Normal   Assets  Assets: Desire for Improvement; Housing; Physical Health; Resilience; Social Support   Sleep  Sleep: Sleep: Fair    Physical Exam: Physical Exam Vitals and nursing note reviewed.  Constitutional:      Appearance: Normal appearance.  HENT:     Head: Normocephalic and atraumatic.     Mouth/Throat:     Pharynx: Oropharynx is clear.  Eyes:     Pupils: Pupils are equal, round, and reactive to light.  Cardiovascular:     Rate and Rhythm: Normal rate and regular rhythm.  Pulmonary:     Effort: Pulmonary effort is normal.     Breath sounds: Normal breath sounds.  Abdominal:     General: Abdomen is  flat.     Palpations: Abdomen is soft.  Musculoskeletal:        General: Normal range of motion.  Skin:    General: Skin  is warm and dry.  Neurological:     General: No focal deficit present.     Mental Status: He is alert. Mental status is at baseline.  Psychiatric:        Attention and Perception: He is inattentive.        Mood and Affect: Mood is anxious. Affect is labile and inappropriate.        Speech: Speech is rapid and pressured.        Behavior: Behavior is agitated and hyperactive. Behavior is not aggressive.        Thought Content: Thought content is paranoid.        Cognition and Memory: Memory is impaired.        Judgment: Judgment is impulsive.    Review of Systems  Constitutional: Negative.   HENT: Negative.    Eyes: Negative.   Respiratory: Negative.    Cardiovascular: Negative.   Gastrointestinal: Negative.   Musculoskeletal: Negative.   Skin: Negative.   Neurological: Negative.   Psychiatric/Behavioral:  Positive for substance abuse. Negative for depression, hallucinations and suicidal ideas. The patient is nervous/anxious and has insomnia.    Blood pressure (!) 138/101, pulse 93, temperature 97.8 F (36.6 C), temperature source Oral, resp. rate 17, height 5\' 10"  (1.778 m), weight 95.3 kg, SpO2 100 %. Body mass index is 30.15 kg/m.   COGNITIVE FEATURES THAT CONTRIBUTE TO RISK:  Loss of executive function    SUICIDE RISK:   Minimal: No identifiable suicidal ideation.  Patients presenting with no risk factors but with morbid ruminations; may be classified as minimal risk based on the severity of the depressive symptoms  PLAN OF CARE: Continue 15-minute checks.  Suggest starting medicine for treatment consistent with bipolar manic episode.  Ongoing assessment of any dangerousness and involvement in groups and medication management prior to discharge.  I certify that inpatient services furnished can reasonably be expected to improve the patient's condition.   Alethia Berthold, MD 05/30/2022, 11:52 AM

## 2022-05-31 LAB — HEMOGLOBIN A1C
Hgb A1c MFr Bld: 5.1 % (ref 4.8–5.6)
Mean Plasma Glucose: 99.67 mg/dL

## 2022-05-31 LAB — LIPID PANEL
Cholesterol: 138 mg/dL (ref 0–200)
HDL: 28 mg/dL — ABNORMAL LOW (ref >40)
LDL Cholesterol: 88 mg/dL (ref 0–99)
Total CHOL/HDL Ratio: 4.9 RATIO
Triglycerides: 112 mg/dL (ref <150)
VLDL: 22 mg/dL (ref 0–40)

## 2022-05-31 LAB — TSH: TSH: 0.297 u[IU]/mL — ABNORMAL LOW (ref 0.350–4.500)

## 2022-05-31 MED ORDER — QUETIAPINE FUMARATE 200 MG PO TABS
200.0000 mg | ORAL_TABLET | Freq: Every day | ORAL | Status: DC
  Administered 2022-05-31: 200 mg via ORAL
  Filled 2022-05-31: qty 1

## 2022-05-31 MED ORDER — NICOTINE 14 MG/24HR TD PT24
14.0000 mg | MEDICATED_PATCH | Freq: Every day | TRANSDERMAL | Status: DC
  Administered 2022-05-31 – 2022-06-10 (×10): 14 mg via TRANSDERMAL
  Filled 2022-05-31 (×11): qty 1

## 2022-05-31 NOTE — Progress Notes (Signed)
[facility] MD Progress Note  05/31/2022 3:12 PM Matthew Gonzales  MRN:  [id number] Subjective: Follow-up with this 32 year old man who presented to the hospital with what appeared to be clinically manic symptoms.  Patient has been agitated this afternoon pacing around the unit pounding on my door at inappropriate times using profanity with the nursing staff.  When I did however sit down to speak with him in my office he did not have anything to say.  Just told me he was doing fine.  Patient was muttering to himself making a sides seemed to be confused and distracted at times.  He did take the 100 mg of Seroquel last night. Principal Problem: Bipolar I disorder, most recent episode (or current) manic ([facility]) Diagnosis: Principal Problem:   Bipolar I disorder, most recent episode (or current) manic ([facility]) Active Problems:   Psychosis ([facility])   Cannabis abuse   Hypertension  Total Time spent with patient: 30 minutes  Past Psychiatric History: Patient does not have a known past history of major depression psychosis or bipolar disorder  Past Medical History:  Past Medical History:  Diagnosis Date   Epidermal inclusion cyst    HSV-2 (herpes simplex virus 2) infection     Past Surgical History:  Procedure Laterality Date   CYST EXCISION  05/09/15   Epidermal Inclusion Cyst- Back (Dr. Orvis Brill)   vertical sleeve gastrectomy     Family History:  Family History  Problem Relation Age of Onset   Hypothyroidism Mother    Hyperlipidemia Father    Pancreatic cancer Maternal Grandmother    Stroke Paternal Grandmother    Cataracts Paternal Grandfather    Family Psychiatric  History: None reported Social History:  Social History   Substance and Sexual Activity  Alcohol Use Yes   Alcohol/week: 0.0 standard drinks of alcohol   Comment: rarely     Social History   Substance and Sexual Activity  Drug Use Yes   Types: Marijuana   Comment: daily cannabis use    Social History   Socioeconomic History    Marital status: Single    Spouse name: Not on file   Number of children: Not on file   Years of education: Not on file   Highest education level: Not on file  Occupational History   Not on file  Tobacco Use   Smoking status: Former    Packs/day: 0.50    Types: Cigarettes   Smokeless tobacco: Never  Vaping Use   Vaping Use: Every day  Substance and Sexual Activity   Alcohol use: Yes    Alcohol/week: 0.0 standard drinks of alcohol    Comment: rarely   Drug use: Yes    Types: Marijuana    Comment: daily cannabis use   Sexual activity: Yes  Other Topics Concern   Not on file  Social History Narrative   Not on file   Social Determinants of Health   Financial Resource Strain: Not on file  Food Insecurity: No Food Insecurity (05/29/2022)   Hunger Vital Sign    Worried About Running Out of Food in the Last Year: Never true    Ran Out of Food in the Last Year: Never true  Transportation Needs: No Transportation Needs (05/29/2022)   PRAPARE - Administrator, Civil Service (Medical): No    Lack of Transportation (Non-Medical): No  Physical Activity: Not on file  Stress: Not on file  Social Connections: Not on file   Additional Social History:  Sleep: Fair  Appetite:  Fair  Current Medications: Current Facility-Administered Medications  Medication Dose Route Frequency Provider Last Rate Last Admin   acyclovir (ZOVIRAX) 200 MG capsule 400 mg  400 mg Oral BID Caroline Sauger, NP   400 mg at 05/31/22 1121   alum & mag hydroxide-simeth (MAALOX/MYLANTA) 200-200-20 MG/5ML suspension 30 mL  30 mL Oral Q4H PRN Caroline Sauger, NP       atorvastatin (LIPITOR) tablet 40 mg  40 mg Oral Daily Caroline Sauger, NP   40 mg at 05/31/22 1121   hydrOXYzine (ATARAX) tablet 50 mg  50 mg Oral Q6H PRN Armany Mano, Madie Reno, MD       ibuprofen (ADVIL) tablet 800 mg  800 mg Oral Q12H PRN Caroline Sauger, NP   800 mg at 05/31/22 1227    lisinopril (ZESTRIL) tablet 10 mg  10 mg Oral Daily Glendora Clouatre, Madie Reno, MD   10 mg at 05/31/22 1121   risperiDONE (RISPERDAL M-TABS) disintegrating tablet 2 mg  2 mg Oral Q8H PRN Tommye Lehenbauer, Madie Reno, MD       And   LORazepam (ATIVAN) tablet 1 mg  1 mg Oral PRN Arvis Miguez, Madie Reno, MD       And   ziprasidone (GEODON) injection 20 mg  20 mg Intramuscular PRN Annistyn Depass, Madie Reno, MD       magnesium hydroxide (MILK OF MAGNESIA) suspension 30 mL  30 mL Oral Daily PRN Caroline Sauger, NP       metaxalone (SKELAXIN) tablet 800 mg  800 mg Oral TID PRN Caroline Sauger, NP       nicotine (NICODERM CQ - dosed in mg/24 hours) patch 14 mg  14 mg Transdermal Daily Rain Wilhide, Madie Reno, MD   14 mg at 05/31/22 1228   nicotine polacrilex (NICORETTE) gum 2 mg  2 mg Oral PRN Medardo Hassing, Madie Reno, MD   2 mg at 05/30/22 1646   QUEtiapine (SEROQUEL) tablet 200 mg  200 mg Oral QHS Dwight Adamczak, Madie Reno, MD        Lab Results:  Results for orders placed or performed during the hospital encounter of 05/29/22 (from the past 48 hour(s))  Lipid panel     Status: Abnormal   Collection Time: 05/31/22  6:38 AM  Result Value Ref Range   Cholesterol 138 0 - 200 mg/dL   Triglycerides 112 <150 mg/dL   HDL 28 (L) >40 mg/dL   Total CHOL/HDL Ratio 4.9 RATIO   VLDL 22 0 - 40 mg/dL   LDL Cholesterol 88 0 - 99 mg/dL    Comment:        Total Cholesterol/HDL:CHD Risk Coronary Heart Disease Risk Table                     Men   Women  1/2 Average Risk   3.4   3.3  Average Risk       5.0   4.4  2 X Average Risk   9.6   7.1  3 X Average Risk  23.4   11.0        Use the calculated Patient Ratio above and the CHD Risk Table to determine the patient's CHD Risk.        ATP III CLASSIFICATION (LDL):  <100     mg/dL   Optimal  100-129  mg/dL   Near or Above                    Optimal  130-159  mg/dL   Borderline  160-189  mg/dL   High  >190     mg/dL   Very High Performed at [facility], [address] [place], [place] [address]    Hemoglobin A1c     Status: None   Collection Time: 05/31/22  6:38 AM  Result Value Ref Range   Hgb A1c MFr Bld 5.1 4.8 - 5.6 %    Comment: (NOTE) Pre diabetes:          5.7%-6.4%  Diabetes:              >6.4%  Glycemic control for   <7.0% adults with diabetes    Mean Plasma Glucose 99.67 mg/dL    Comment: Performed at [facility] [address] [place], [place] [address]  TSH     Status: Abnormal   Collection Time: 05/31/22  6:38 AM  Result Value Ref Range   TSH 0.297 (L) 0.350 - 4.500 uIU/mL    Comment: Performed by a 3rd Generation assay with a functional sensitivity of <=0.01 uIU/mL. Performed at [facility], [address] [place], [place] [address]     Blood Alcohol level:  Lab Results  Component Value Date   [facility] <10 0000000    Metabolic Disorder Labs: Lab Results  Component Value Date   HGBA1C 5.1 05/31/2022   MPG 99.67 05/31/2022   No results found for: "PROLACTIN" Lab Results  Component Value Date   CHOL 138 05/31/2022   TRIG 112 05/31/2022   HDL 28 (L) 05/31/2022   CHOLHDL 4.9 05/31/2022   VLDL 22 05/31/2022   LDLCALC 88 05/31/2022    Physical Findings: AIMS:  , ,  ,  ,    CIWA:    COWS:     Musculoskeletal: Strength & Muscle Tone: within normal limits Gait & Station: normal Patient leans: N/A  Psychiatric Specialty Exam:  Presentation  General Appearance:  Bizarre  Eye Contact: Good  Speech: Clear and Coherent  Speech Volume: Normal  Handedness: Right   Mood and Affect  Mood: Dysphoric  Affect: Inappropriate   Thought Process  Thought Processes: Disorganized; Irrevelant  Descriptions of Associations:Loose  Orientation:Partial  Thought Content:Illogical  History of Schizophrenia/Schizoaffective disorder:No  Duration of Psychotic Symptoms:Less than six months  Hallucinations:No data recorded Ideas of Reference:Paranoia  Suicidal Thoughts:No data recorded Homicidal Thoughts:No data  recorded  Sensorium  Memory: Immediate Poor  Judgment: Impaired  Insight: Poor   Executive Functions  Concentration: Poor  Attention Span: Poor  Recall: Poor  Fund of Knowledge: Poor  Language: Poor   Psychomotor Activity  Psychomotor Activity:No data recorded  Assets  Assets: Desire for Improvement; Housing; Physical Health; Resilience; Social Support   Sleep  Sleep:No data recorded   Physical Exam: Physical Exam Vitals and nursing note reviewed.  Constitutional:      Appearance: Normal appearance.  HENT:     Head: Normocephalic and atraumatic.     Mouth/Throat:     Pharynx: Oropharynx is clear.  Eyes:     Pupils: Pupils are equal, round, and reactive to light.  Cardiovascular:     Rate and Rhythm: Normal rate and regular rhythm.  Pulmonary:     Effort: Pulmonary effort is normal.     Breath sounds: Normal breath sounds.  Abdominal:     General: Abdomen is flat.     Palpations: Abdomen is soft.  Musculoskeletal:        General: Normal range of motion.  Skin:  General: Skin is warm and dry.  Neurological:     General: No focal deficit present.     Mental Status: He is alert. Mental status is at baseline.  Psychiatric:        Attention and Perception: He is inattentive.        Mood and Affect: Mood normal. Affect is labile.        Speech: Speech is tangential.        Behavior: Behavior is agitated. Behavior is not aggressive.        Thought Content: Thought content normal.    Review of Systems  Constitutional: Negative.   HENT: Negative.    Eyes: Negative.   Respiratory: Negative.    Cardiovascular: Negative.   Gastrointestinal: Negative.   Musculoskeletal: Negative.   Skin: Negative.   Neurological: Negative.   Psychiatric/Behavioral: Negative.     Blood pressure 131/89, pulse (!) 104, temperature 98.1 F (36.7 C), temperature source Oral, resp. rate 18, height 5\' 10"  (1.778 m), weight 95.3 kg, SpO2 97 %. Body mass index is  30.15 kg/m.   Treatment Plan Summary: Medication management and Plan patient continues to display symptoms consistent with mania.  Hyperactive very distractible labile mood inappropriate behavior.  Dose of Seroquel will be increased to 200 mg tonight.  Patient informed of the plan.  Follow-up tomorrow.  Alethia Berthold, MD 05/31/2022, 3:12 PM

## 2022-05-31 NOTE — Plan of Care (Signed)
  Problem: Coping: ?Goal: Level of anxiety will decrease ?Outcome: Progressing ?  ?Problem: Safety: ?Goal: Ability to remain free from injury will improve ?Outcome: Progressing ?  ?

## 2022-05-31 NOTE — Progress Notes (Signed)
Patient stated that he will get up and take his medication after he wakes up and eats something. MD will be notified during progression rounds.

## 2022-05-31 NOTE — Plan of Care (Signed)
D- Patient alert and oriented. Patient presented in a preoccupied mood on assessment reporting that he slept good last night and had complaints of a headache. Patient rated his pain a "4/10", in which he did request PRN medication for relief. Patient endorsed hopelessness, depression and anxiety, but when this writer questioned him about this, he stated "no, I'm just hungry". Patient also denied SI, HI, AVH. Per his self-inventory, patient's goal for today is "group".   A- Scheduled medications administered to patient, per MD orders. Support and encouragement provided.  Routine safety checks conducted every 15 minutes.  Patient informed to notify staff with problems or concerns.  R- No adverse drug reactions noted. Patient contracts for safety at this time. Patient compliant with medications. Patient remains safe at this time.  Problem: Education: Goal: Knowledge of General Education information will improve Description: Including pain rating scale, medication(s)/side effects and non-pharmacologic comfort measures Outcome: Not Progressing   Problem: Health Behavior/Discharge Planning: Goal: Ability to manage health-related needs will improve Outcome: Not Progressing   Problem: Clinical Measurements: Goal: Ability to maintain clinical measurements within normal limits will improve Outcome: Not Progressing Goal: Will remain free from infection Outcome: Not Progressing Goal: Diagnostic test results will improve Outcome: Not Progressing Goal: Respiratory complications will improve Outcome: Not Progressing Goal: Cardiovascular complication will be avoided Outcome: Not Progressing   Problem: Activity: Goal: Risk for activity intolerance will decrease Outcome: Not Progressing   Problem: Nutrition: Goal: Adequate nutrition will be maintained Outcome: Not Progressing   Problem: Coping: Goal: Level of anxiety will decrease Outcome: Not Progressing   Problem: Elimination: Goal: Will not  experience complications related to bowel motility Outcome: Not Progressing Goal: Will not experience complications related to urinary retention Outcome: Not Progressing   Problem: Pain Managment: Goal: General experience of comfort will improve Outcome: Not Progressing   Problem: Safety: Goal: Ability to remain free from injury will improve Outcome: Not Progressing   Problem: Skin Integrity: Goal: Risk for impaired skin integrity will decrease Outcome: Not Progressing

## 2022-05-31 NOTE — BH IP Treatment Plan (Signed)
Interdisciplinary Treatment and Diagnostic Plan Update  05/31/2022 Time of Session: 09:45 Matthew Gonzales MRN: [id number]  Principal Diagnosis: Bipolar I disorder, most recent episode (or current) manic ([place])  Secondary Diagnoses: Principal Problem:   Bipolar I disorder, most recent episode (or current) manic ([place]) Active Problems:   Psychosis (Matthew Gonzales)   Cannabis abuse   Hypertension   Current Medications:  Current Facility-Administered Medications  Medication Dose Route Frequency Provider Last Rate Last Admin   acyclovir (ZOVIRAX) 200 MG capsule 400 mg  400 mg Oral BID Caroline Sauger, NP   400 mg at 05/30/22 1646   alum & mag hydroxide-simeth (MAALOX/MYLANTA) 200-200-20 MG/5ML suspension 30 mL  30 mL Oral Q4H PRN Caroline Sauger, NP       atorvastatin (LIPITOR) tablet 40 mg  40 mg Oral Daily Caroline Sauger, NP   40 mg at 05/30/22 7673   hydrOXYzine (ATARAX) tablet 50 mg  50 mg Oral Q6H PRN Clapacs, Madie Reno, MD       ibuprofen (ADVIL) tablet 800 mg  800 mg Oral Q12H PRN Caroline Sauger, NP   800 mg at 05/30/22 1954   lisinopril (ZESTRIL) tablet 10 mg  10 mg Oral Daily Clapacs, Madie Reno, MD   10 mg at 05/30/22 1646   risperiDONE (RISPERDAL M-TABS) disintegrating tablet 2 mg  2 mg Oral Q8H PRN Clapacs, Madie Reno, MD       And   LORazepam (ATIVAN) tablet 1 mg  1 mg Oral PRN Clapacs, Madie Reno, MD       And   ziprasidone (GEODON) injection 20 mg  20 mg Intramuscular PRN Clapacs, John T, MD       magnesium hydroxide (MILK OF MAGNESIA) suspension 30 mL  30 mL Oral Daily PRN Caroline Sauger, NP       metaxalone (SKELAXIN) tablet 800 mg  800 mg Oral TID PRN Caroline Sauger, NP       nicotine polacrilex (NICORETTE) gum 2 mg  2 mg Oral PRN Clapacs, John T, MD   2 mg at 05/30/22 1646   QUEtiapine (SEROQUEL) tablet 100 mg  100 mg Oral QHS Clapacs, John T, MD   100 mg at 05/30/22 2107   PTA Medications: Medications Prior to Admission  Medication Sig Dispense Refill Last Dose    acyclovir (ZOVIRAX) 400 MG tablet TAKE 1 TABLET BY MOUTH TWICE A DAY 180 tablet 0    atorvastatin (LIPITOR) 40 MG tablet Take 40 mg by mouth daily.      DULoxetine (CYMBALTA) 30 MG capsule Take 30 mg by mouth at bedtime. (Patient not taking: Reported on 05/29/2022)      escitalopram (LEXAPRO) 20 MG tablet Take 20 mg by mouth daily.      ibuprofen (ADVIL) 800 MG tablet Take 800 mg by mouth every 12 (twelve) hours as needed for mild pain.      meloxicam (MOBIC) 15 MG tablet Take 1 tablet (15 mg total) by mouth daily as needed for pain. (Patient not taking: Reported on 05/29/2022) 30 tablet 0    metaxalone (SKELAXIN) 800 MG tablet Take 1 tablet (800 mg total) by mouth 3 (three) times daily as needed for muscle spasms. 30 tablet 0     Patient Stressors: Medication change or noncompliance   Substance abuse    Patient Strengths: Ability for insight  Motivation for treatment/growth   Treatment Modalities: Medication Management, Group therapy, Case management,  1 to 1 session with clinician, Psychoeducation, Recreational therapy.   Physician Treatment Plan for Primary Diagnosis: Bipolar  I disorder, most recent episode (or current) manic ([place]) Long Term Goal(s): Improvement in symptoms so as ready for discharge   Short Term Goals: Ability to maintain clinical measurements within normal limits will improve Compliance with prescribed medications will improve Ability to verbalize feelings will improve Ability to demonstrate self-control will improve Ability to identify and develop effective coping behaviors will improve  Medication Management: Evaluate patient's response, side effects, and tolerance of medication regimen.  Therapeutic Interventions: 1 to 1 sessions, Unit Group sessions and Medication administration.  Evaluation of Outcomes: Not Met  Physician Treatment Plan for Secondary Diagnosis: Principal Problem:   Bipolar I disorder, most recent episode (or current) manic ([place]) Active  Problems:   Psychosis ([place])   Cannabis abuse   Hypertension  Long Term Goal(s): Improvement in symptoms so as ready for discharge   Short Term Goals: Ability to maintain clinical measurements within normal limits will improve Compliance with prescribed medications will improve Ability to verbalize feelings will improve Ability to demonstrate self-control will improve Ability to identify and develop effective coping behaviors will improve     Medication Management: Evaluate patient's response, side effects, and tolerance of medication regimen.  Therapeutic Interventions: 1 to 1 sessions, Unit Group sessions and Medication administration.  Evaluation of Outcomes: Not Met   RN Treatment Plan for Primary Diagnosis: Bipolar I disorder, most recent episode (or current) manic ([place]) Long Term Goal(s): Knowledge of disease and therapeutic regimen to maintain health will improve  Short Term Goals: Ability to remain free from injury will improve, Ability to verbalize frustration and anger appropriately will improve, Ability to demonstrate self-control, Ability to participate in decision making will improve, Ability to verbalize feelings will improve, Ability to disclose and discuss suicidal ideas, Ability to identify and develop effective coping behaviors will improve, and Compliance with prescribed medications will improve  Medication Management: RN will administer medications as ordered by provider, will assess and evaluate patient's response and provide education to patient for prescribed medication. RN will report any adverse and/or side effects to prescribing provider.  Therapeutic Interventions: 1 on 1 counseling sessions, Psychoeducation, Medication administration, Evaluate responses to treatment, Monitor vital signs and CBGs as ordered, Perform/monitor CIWA, COWS, AIMS and Fall Risk screenings as ordered, Perform wound care treatments as ordered.  Evaluation of Outcomes: Not Met   LCSW  Treatment Plan for Primary Diagnosis: Bipolar I disorder, most recent episode (or current) manic ([place]) Long Term Goal(s): Safe transition to appropriate next level of care at discharge, Engage patient in therapeutic group addressing interpersonal concerns.  Short Term Goals: Engage patient in aftercare planning with referrals and resources, Increase social support, Increase ability to appropriately verbalize feelings, Increase emotional regulation, Facilitate acceptance of mental health diagnosis and concerns, and Increase skills for wellness and recovery  Therapeutic Interventions: Assess for all discharge needs, 1 to 1 time with Social worker, Explore available resources and support systems, Assess for adequacy in community support network, Educate family and significant other(s) on suicide prevention, Complete Psychosocial Assessment, Interpersonal group therapy.  Evaluation of Outcomes: Not Met   Progress in Treatment: Attending groups: No. Participating in groups: No. Taking medication as prescribed: Yes. Toleration medication: Yes. Family/Significant other contact made: Yes, individual(s) contacted:  mother, Torell Minder. Patient understands diagnosis: No. Discussing patient identified problems/goals with staff: No. Medical problems stabilized or resolved: Yes. Denies suicidal/homicidal ideation: No. Issues/concerns per patient self-inventory: No. Other: none.  New problem(s) identified: No, Describe:  none identified  New Short Term/Long Term Goal(s): elimination of symptoms  of psychosis, medication management for mood stabilization; elimination of SI thoughts; development of comprehensive mental wellness plan.   Patient Goals:  Pt declined to participate in the treatment team meeting despite personal invitation from the nurse.   Discharge Plan or Barriers: CSW will assist pt with development of an appropriate aftercare/discharge plan.   Reason for Continuation of  Hospitalization: Delusions  Hallucinations Medication stabilization Suicidal ideation Other; describe Psychosis  Estimated Length of Stay: 1-7 days  Last 3 [place] Suicide Severity Risk Score: Flowsheet Row Admission (Current) from 05/29/2022 in [facility] INPATIENT BEHAV[facility]AL MEDICINE Most recent reading at 05/29/2022  9:58 PM ED from 05/29/2022 in [facility] Emergency Department at [facility] Most recent reading at 05/29/2022  1:59 PM  C-SSRS RISK CATEGORY No Risk No Risk       Last PHQ 2/9 Scores:     No data to display          Scribe for Treatment Team: Glenis Smoker, LCSW 05/31/2022 10:28 AM

## 2022-05-31 NOTE — Progress Notes (Signed)
Patient presents flat and anxious. Minimal interaction. Denies SI, HI, AVH. Endorses anxiety, wanted meds to calm him down and sleep. HS meds given with good relief. Poor historian, minimal response to questions. Reports there is nothing wrong with him and he would like to go home. Encouragement and support provided. Safety checks maintained. Medications given as prescribed. Pt receptive and remains safe on unit with q 15 min checks.

## 2022-05-31 NOTE — Progress Notes (Signed)
Patient has had to be redirected multiple times throughout the day from pacing in front of the nurses station, starring at the other staff nurse. Patient stated to this writer, "it's fucked up that Bella Kennedy saw me on [employer] and followed me here".

## 2022-05-31 NOTE — Group Note (Signed)
LCSW Group Therapy Note  Group Date: 05/31/2022 Start Time: 1300 End Time: 1400   Type of Therapy and Topic:  Group Therapy - Healthy vs Unhealthy Coping Skills  Participation Level:  Did Not Attend   Description of Group The focus of this group was to determine what unhealthy coping techniques typically are used by group members and what healthy coping techniques would be helpful in coping with various problems. Patients were guided in becoming aware of the differences between healthy and unhealthy coping techniques. Patients were asked to identify 2-3 healthy coping skills they would like to learn to use more effectively.  Therapeutic Goals Patients learned that coping is what human beings do all day long to deal with various situations in their lives Patients defined and discussed healthy vs unhealthy coping techniques Patients identified their preferred coping techniques and identified whether these were healthy or unhealthy Patients determined 2-3 healthy coping skills they would like to become more familiar with and use more often. Patients provided support and ideas to each other   Summary of Patient Progress:  Patient did not attend group despite encouraged participation.   Therapeutic Modalities Cognitive Behavioral Therapy Motivational Interviewing  Larose Kells 05/31/2022  2:45 PM

## 2022-06-01 MED ORDER — QUETIAPINE FUMARATE 200 MG PO TABS: 400.0000 mg | ORAL_TABLET | Freq: Every day | ORAL | Status: DC

## 2022-06-01 MED ORDER — QUETIAPINE FUMARATE 200 MG PO TABS
300.0000 mg | ORAL_TABLET | Freq: Every day | ORAL | Status: DC
  Administered 2022-06-01: 300 mg via ORAL
  Filled 2022-06-01: qty 1

## 2022-06-01 MED ORDER — ZIPRASIDONE MESYLATE 20 MG IM SOLR
20.0000 mg | Freq: Two times a day (BID) | INTRAMUSCULAR | Status: DC | PRN
  Administered 2022-06-01: 20 mg via INTRAMUSCULAR
  Filled 2022-06-01: qty 20

## 2022-06-01 NOTE — Progress Notes (Signed)
[facility] MD Progress Note  06/01/2022 1:35 PM Matthew Gonzales  MRN:  [id number] Subjective: Patient seen and chart reviewed.  Patient still has limited insight but has no new complaints.  Staff reports he was agitated and required sedation last night and was sleeping late this morning. Principal Problem: Bipolar I disorder, most recent episode (or current) manic ([place]) Diagnosis: Principal Problem:   Bipolar I disorder, most recent episode (or current) manic ([place]) Active Problems:   Psychosis ([place])   Cannabis abuse   Hypertension  Total Time spent with patient: 30 minutes  Past Psychiatric History: No significant known past history  Past Medical History:  Past Medical History:  Diagnosis Date   Epidermal inclusion cyst    HSV-2 (herpes simplex virus 2) infection     Past Surgical History:  Procedure Laterality Date   CYST EXCISION  05/09/15   Epidermal Inclusion Cyst- Back (Dr. Azalee Course)   vertical sleeve gastrectomy     Family History:  Family History  Problem Relation Age of Onset   Hypothyroidism Mother    Hyperlipidemia Father    Pancreatic cancer Maternal Grandmother    Stroke Paternal Grandmother    Cataracts Paternal Grandfather    Family Psychiatric  History: See previous Social History:  Social History   Substance and Sexual Activity  Alcohol Use Yes   Alcohol/week: 0.0 standard drinks of alcohol   Comment: rarely     Social History   Substance and Sexual Activity  Drug Use Yes   Types: Marijuana   Comment: daily cannabis use    Social History   Socioeconomic History   Marital status: Single    Spouse name: Not on file   Number of children: Not on file   Years of education: Not on file   Highest education level: Not on file  Occupational History   Not on file  Tobacco Use   Smoking status: Former    Packs/day: 0.50    Types: Cigarettes   Smokeless tobacco: Never  Vaping Use   Vaping Use: Every day  Substance and Sexual Activity   Alcohol use: Yes     Alcohol/week: 0.0 standard drinks of alcohol    Comment: rarely   Drug use: Yes    Types: Marijuana    Comment: daily cannabis use   Sexual activity: Yes  Other Topics Concern   Not on file  Social History Narrative   Not on file   Social Determinants of Health   Financial Resource Strain: Not on file  Food Insecurity: No Food Insecurity (05/29/2022)   Hunger Vital Sign    Worried About Running Out of Food in the Last Year: Never true    Ran Out of Food in the Last Year: Never true  Transportation Needs: No Transportation Needs (05/29/2022)   PRAPARE - Hydrologist (Medical): No    Lack of Transportation (Non-Medical): No  Physical Activity: Not on file  Stress: Not on file  Social Connections: Not on file   Additional Social History:                         Sleep: Fair  Appetite:  Fair  Current Medications: Current Facility-Administered Medications  Medication Dose Route Frequency Provider Last Rate Last Admin   acyclovir (ZOVIRAX) 200 MG capsule 400 mg  400 mg Oral BID Caroline Sauger, NP   400 mg at 05/31/22 1737   alum & mag hydroxide-simeth (MAALOX/MYLANTA) 200-200-20 MG/5ML suspension  30 mL  30 mL Oral Q4H PRN Gillermo Murdoch, NP       atorvastatin (LIPITOR) tablet 40 mg  40 mg Oral Daily Gillermo Murdoch, NP   40 mg at 05/31/22 1121   hydrOXYzine (ATARAX) tablet 50 mg  50 mg Oral Q6H PRN Samiel Peel, Jackquline Denmark, MD   50 mg at 05/31/22 2309   ibuprofen (ADVIL) tablet 800 mg  800 mg Oral Q12H PRN Gillermo Murdoch, NP   800 mg at 05/31/22 1227   lisinopril (ZESTRIL) tablet 10 mg  10 mg Oral Daily Maahi Lannan, Jackquline Denmark, MD   10 mg at 05/31/22 1121   magnesium hydroxide (MILK OF MAGNESIA) suspension 30 mL  30 mL Oral Daily PRN Gillermo Murdoch, NP       metaxalone [facility]) tablet 800 mg  800 mg Oral TID PRN Gillermo Murdoch, NP       nicotine (NICODERM CQ - dosed in mg/24 hours) patch 14 mg  14 mg Transdermal Daily  Toran Murch, Jackquline Denmark, MD   14 mg at 05/31/22 1228   nicotine polacrilex (NICORETTE) gum 2 mg  2 mg Oral PRN Fionn Stracke, Jackquline Denmark, MD   2 mg at 05/30/22 1646   QUEtiapine (SEROQUEL) tablet 400 mg  400 mg Oral QHS Maureena Dabbs, Jackquline Denmark, MD       risperiDONE (RISPERDAL M-TABS) disintegrating tablet 2 mg  2 mg Oral Q8H PRN Polina Burmaster, Jackquline Denmark, MD        Lab Results:  Results for orders placed or performed during the hospital encounter of 05/29/22 (from the past 48 hour(s))  Lipid panel     Status: Abnormal   Collection Time: 05/31/22  6:38 AM  Result Value Ref Range   Cholesterol 138 0 - 200 mg/dL   Triglycerides 751 <025 mg/dL   HDL 28 (L) >85 mg/dL   Total CHOL/HDL Ratio 4.9 RATIO   VLDL 22 0 - 40 mg/dL   LDL Cholesterol 88 0 - 99 mg/dL    Comment:        Total Cholesterol/HDL:CHD Risk Coronary Heart Disease Risk Table                     Men   Women  1/2 Average Risk   3.4   3.3  Average Risk       5.0   4.4  2 X Average Risk   9.6   7.1  3 X Average Risk  23.4   11.0        Use the calculated Patient Ratio above and the CHD Risk Table to determine the patient's CHD Risk.        ATP III CLASSIFICATION (LDL):  <100     mg/dL   Optimal  277-824  mg/dL   Near or Above                    Optimal  130-159  mg/dL   Borderline  235-361  mg/dL   High  >443     mg/dL   Very High Performed at [facility], [address] [place], [place] [address]   Hemoglobin A1c     Status: None   Collection Time: 05/31/22  6:38 AM  Result Value Ref Range   Hgb A1c MFr Bld 5.1 4.8 - 5.6 %    Comment: (NOTE) Pre diabetes:          5.7%-6.4%  Diabetes:              >  6.4%  Glycemic control for   <7.0% adults with diabetes    Mean Plasma Glucose 99.67 mg/dL    Comment: Performed at [facility] [address] [place], [place] [address]  TSH     Status: Abnormal   Collection Time: 05/31/22  6:38 AM  Result Value Ref Range   TSH 0.297 (L) 0.350 - 4.500 uIU/mL    Comment: Performed by a 3rd  Generation assay with a functional sensitivity of <=0.01 uIU/mL. Performed at [facility], [address] [place], [place] [address]     Blood Alcohol level:  Lab Results  Component Value Date   [facility] <10 79/06/4095    Metabolic Disorder Labs: Lab Results  Component Value Date   HGBA1C 5.1 05/31/2022   MPG 99.67 05/31/2022   No results found for: "PROLACTIN" Lab Results  Component Value Date   CHOL 138 05/31/2022   TRIG 112 05/31/2022   HDL 28 (L) 05/31/2022   CHOLHDL 4.9 05/31/2022   VLDL 22 05/31/2022   LDLCALC 88 05/31/2022    Physical Findings: AIMS:  , ,  ,  ,    CIWA:    COWS:     Musculoskeletal: Strength & Muscle Tone: within normal limits Gait & Station: normal Patient leans: N/A  Psychiatric Specialty Exam:  Presentation  General Appearance:  Bizarre  Eye Contact: Good  Speech: Clear and Coherent  Speech Volume: Normal  Handedness: Right   Mood and Affect  Mood: Dysphoric  Affect: Inappropriate   Thought Process  Thought Processes: Disorganized; Irrevelant  Descriptions of Associations:Loose  Orientation:Partial  Thought Content:Illogical  History of Schizophrenia/Schizoaffective disorder:No  Duration of Psychotic Symptoms:Less than six months  Hallucinations:No data recorded Ideas of Reference:Paranoia  Suicidal Thoughts:No data recorded Homicidal Thoughts:No data recorded  Sensorium  Memory: Immediate Poor  Judgment: Impaired  Insight: Poor   Executive Functions  Concentration: Poor  Attention Span: Poor  Recall: Poor  Fund of Knowledge: Poor  Language: Poor   Psychomotor Activity  Psychomotor Activity:No data recorded  Assets  Assets: Desire for Improvement; Housing; Physical Health; Resilience; Social Support   Sleep  Sleep:No data recorded   Physical Exam: Physical Exam Vitals and nursing note reviewed.  Constitutional:      Appearance: Normal appearance.  HENT:      Head: Normocephalic and atraumatic.     Mouth/Throat:     Pharynx: Oropharynx is clear.  Eyes:     Pupils: Pupils are equal, round, and reactive to light.  Cardiovascular:     Rate and Rhythm: Normal rate and regular rhythm.  Pulmonary:     Effort: Pulmonary effort is normal.     Breath sounds: Normal breath sounds.  Abdominal:     General: Abdomen is flat.     Palpations: Abdomen is soft.  Musculoskeletal:        General: Normal range of motion.  Skin:    General: Skin is warm and dry.  Neurological:     General: No focal deficit present.     Mental Status: He is alert. Mental status is at baseline.  Psychiatric:        Attention and Perception: He is inattentive.        Mood and Affect: Affect is inappropriate.        Thought Content: Thought content normal.    Review of Systems  Constitutional: Negative.   HENT: Negative.    Eyes: Negative.   Respiratory: Negative.    Cardiovascular: Negative.  Gastrointestinal: Negative.   Musculoskeletal: Negative.   Skin: Negative.   Neurological: Negative.   Psychiatric/Behavioral: Negative.     Blood pressure 139/89, pulse (!) 109, temperature 99.2 F (37.3 C), temperature source Oral, resp. rate 18, height 5\' 10"  (1.778 m), weight 95.3 kg, SpO2 97 %. Body mass index is 30.15 kg/m.   Treatment Plan Summary: Medication management and Plan increasing Seroquel to 300 mg tonight.  Supportive counseling.  Patient agreeable.  , MD 06/01/2022, 1:35 PM

## 2022-06-01 NOTE — [facility] Group Notes (Signed)
LCSW Group Therapy Note   06/01/2022 1:15pm   Type of Therapy and Topic:  Group Therapy:  Overcoming Obstacles   Participation Level:  Did Not Attend   Description of Group:    In this group patients will be encouraged to explore what they see as obstacles to their own wellness and recovery. They will be guided to discuss their thoughts, feelings, and behaviors related to these obstacles. The group will process together ways to cope with barriers, with attention given to specific choices patients can make. Each patient will be challenged to identify changes they are motivated to make in order to overcome their obstacles. This group will be process-oriented, with patients participating in exploration of their own experiences as well as giving and receiving support and challenge from other group members.   Therapeutic Goals: Patient will identify personal and current obstacles as they relate to admission. Patient will identify barriers that currently interfere with their wellness or overcoming obstacles.  Patient will identify feelings, thought process and behaviors related to these barriers. Patient will identify two changes they are willing to make to overcome these obstacles:      Summary of Patient Progress      Therapeutic Modalities:   Cognitive Behavioral Therapy Solution Focused Therapy Motivational Interviewing Relapse Prevention Therapy  Joanne Chars, LCSW 06/01/2022 2:43 PM

## 2022-06-01 NOTE — Progress Notes (Signed)
Patient had to receive IM injection in the at the beginning of shift due to yelling , cursing and posturing at staff. He went to to the quiet room for fifteen minutes and still tried to not stay in his room. He was given Geodon. He kept needing redirection from coming to the nurse's station. He was hyperverbal and restless when he was up. He was given Ativan PRN with effect. He is currently in bed resting at this time. He denies SI, HI &AVH.

## 2022-06-01 NOTE — Plan of Care (Signed)
D: Patient alert and oriented. Patient denies pain. Patient denies anxiety and depression. Patient denies SI/HI/AVH. Patient has been asleep until 1310. Patient was in a pleasant mood after waking up. After waking up patient was given meal and scheduled medication. After dinner patient is beginning to get restless going back and forth from the dayroom and his room.   A: Scheduled medications administered to patient, per MD orders.  Support and encouragement provided to patient.  Q15 minute safety checks maintained.   R: Patient compliant with medication administration and treatment plan. No adverse drug reactions noted. Patient remains safe on the unit at this time. Problem: Education: Goal: Knowledge of [place] [facility] Education information/materials will improve Outcome: Progressing Goal: Verbalization of understanding the information provided will improve Outcome: Progressing   Problem: Health Behavior/Discharge Planning: Goal: Compliance with treatment plan for underlying cause of condition will improve Outcome: Progressing

## 2022-06-01 NOTE — Plan of Care (Signed)
  Problem: Nutrition: Goal: Adequate nutrition will be maintained Outcome: Progressing   Problem: Activity: Goal: Sleeping patterns will improve Outcome: Progressing   Problem: Health Behavior/Discharge Planning: Goal: Compliance with treatment plan for underlying cause of condition will improve Outcome: Progressing

## 2022-06-02 MED ORDER — QUETIAPINE FUMARATE 200 MG PO TABS
400.0000 mg | ORAL_TABLET | Freq: Every day | ORAL | Status: DC
  Administered 2022-06-02 – 2022-06-07 (×6): 400 mg via ORAL
  Filled 2022-06-02 (×6): qty 2

## 2022-06-02 MED ORDER — OLANZAPINE 10 MG PO TBDP
10.0000 mg | ORAL_TABLET | Freq: Three times a day (TID) | ORAL | Status: DC | PRN
  Administered 2022-06-02 – 2022-06-07 (×4): 10 mg via ORAL
  Filled 2022-06-02 (×4): qty 1

## 2022-06-02 MED ORDER — LITHIUM CARBONATE ER 300 MG PO TBCR
300.0000 mg | EXTENDED_RELEASE_TABLET | Freq: Two times a day (BID) | ORAL | Status: DC
  Administered 2022-06-02 – 2022-06-03 (×3): 300 mg via ORAL
  Filled 2022-06-02 (×3): qty 1

## 2022-06-02 MED ORDER — LORAZEPAM 1 MG PO TABS
1.0000 mg | ORAL_TABLET | ORAL | Status: AC | PRN
  Administered 2022-06-02: 1 mg via ORAL
  Filled 2022-06-02: qty 1

## 2022-06-02 NOTE — Progress Notes (Signed)
Patient received his QHS medication without incident.  Much calmer mood. Denies si  hi avh depression and anxiety at this encounter. Q15 minute safety checks in place.     C Butler-Nicholson, LPN

## 2022-06-02 NOTE — Progress Notes (Signed)
Patient is combative and aggressive. Interrupted report at shift change. Demanding to be heard. In the face of both staff and security yelling.  Had to be given IM Geodon injection to calm him down. Received the injection without incident, security on hand.  15 minute safety checks in place.    Genia Del, LPN

## 2022-06-02 NOTE — Progress Notes (Signed)
Patient came to the nurses station stating that he talked with his Mother and she explained to him that she has called an left messages for someone to call her back and that has not happened as of yet. Patient also states that he would like for someone to call his family because he is tired of being here, "I can't stay here another fucking night". MD was notified.

## 2022-06-02 NOTE — Progress Notes (Signed)
Patient is obsessed with the other nurse on shift. He has been redirected multiple times to not stare at her through the nurses station window, as well as to stop going into the medication room when she is in there with her patients, during med pass. Patient will walk off and go down to the dayroom for a while and then will come back to his room.

## 2022-06-02 NOTE — Plan of Care (Signed)
D- Patient alert and oriented. Patient presents as restless, suspicious, and preoccupied upon assessment. Patient reports that he slept fair last night, however, it was reported from night shift that he didn't sleep at all. Patient endorsed both depression and anxiety on his self-inventory, and when this writer asked him about it, he stated "I'm just tired of being here. I'm bored, but it's quiet". Patient denies SI, HI, AVH at this time. Patient's goal for today is to "eat".  A- Scheduled medications administered to patient, per MD orders. Support and encouragement provided.  Routine safety checks conducted every 15 minutes.  Patient informed to notify staff with problems or concerns.  R- No adverse drug reactions noted. Patient contracts for safety at this time. Patient compliant with medications. Patient has to be redirected multiple times. Patient remains safe at this time.  Problem: Education: Goal: Knowledge of General Education information will improve Description: Including pain rating scale, medication(s)/side effects and non-pharmacologic comfort measures Outcome: Not Progressing   Problem: Health Behavior/Discharge Planning: Goal: Ability to manage health-related needs will improve Outcome: Not Progressing   Problem: Clinical Measurements: Goal: Ability to maintain clinical measurements within normal limits will improve Outcome: Not Progressing Goal: Will remain free from infection Outcome: Not Progressing Goal: Diagnostic test results will improve Outcome: Not Progressing Goal: Respiratory complications will improve Outcome: Not Progressing Goal: Cardiovascular complication will be avoided Outcome: Not Progressing   Problem: Activity: Goal: Risk for activity intolerance will decrease Outcome: Not Progressing   Problem: Nutrition: Goal: Adequate nutrition will be maintained Outcome: Not Progressing   Problem: Coping: Goal: Level of anxiety will decrease Outcome: Not  Progressing   Problem: Elimination: Goal: Will not experience complications related to bowel motility Outcome: Not Progressing Goal: Will not experience complications related to urinary retention Outcome: Not Progressing   Problem: Pain Managment: Goal: General experience of comfort will improve Outcome: Not Progressing   Problem: Safety: Goal: Ability to remain free from injury will improve Outcome: Not Progressing   Problem: Skin Integrity: Goal: Risk for impaired skin integrity will decrease Outcome: Not Progressing   Problem: Education: Goal: Knowledge of [place] General Education information/materials will improve Outcome: Not Progressing Goal: Emotional status will improve Outcome: Not Progressing Goal: Mental status will improve Outcome: Not Progressing Goal: Verbalization of understanding the information provided will improve Outcome: Not Progressing   Problem: Activity: Goal: Interest or engagement in activities will improve Outcome: Not Progressing Goal: Sleeping patterns will improve Outcome: Not Progressing   Problem: Coping: Goal: Ability to verbalize frustrations and anger appropriately will improve Outcome: Not Progressing Goal: Ability to demonstrate self-control will improve Outcome: Not Progressing   Problem: Health Behavior/Discharge Planning: Goal: Identification of resources available to assist in meeting health care needs will improve Outcome: Not Progressing Goal: Compliance with treatment plan for underlying cause of condition will improve Outcome: Not Progressing   Problem: Physical Regulation: Goal: Ability to maintain clinical measurements within normal limits will improve Outcome: Not Progressing   Problem: Safety: Goal: Periods of time without injury will increase Outcome: Not Progressing

## 2022-06-02 NOTE — Progress Notes (Signed)
   06/02/22 0803  Psych Admission Type (Psych Patients Only)  Admission Status Involuntary  Psychosocial Assessment  Patient Complaints Anxiety;Restlessness;Suspiciousness  Eye Contact Brief  Facial Expression Anxious;Worried  Affect Blunted;Inconsistent with thought content;Preoccupied  Speech Pressured  Interaction Attention-seeking;Flirtatious;Needy;Sexually inappropriate  Motor Activity Restless;Pacing  Appearance/Hygiene Unremarkable;In scrubs  Behavior Characteristics Restless;Pacing  Mood Suspicious;Preoccupied  Aggressive Behavior  Effect No apparent injury  Thought Process  Coherency Circumstantial  Content Preoccupation;Paranoia;Obsessions  Delusions Paranoid  Perception Derealization  Hallucination None reported or observed  Judgment Impaired  Confusion Mild  Danger to Self  Current suicidal ideation? Denies  Danger to Others  Danger to Others None reported or observed

## 2022-06-02 NOTE — Progress Notes (Signed)
Patient became agitated and verbally aggressive towards John, MHT, who was trying to explain to patient that he could not go down the other hallways on the unit. Patient was not receptive to staff and started cursing at MHT. This writer attempted to intervene and verbally de-escalate the situation, as well as try to explain to patient the policies of the unit. Patient was given PRN Ativan to help with agitation and anxiety. Patient tolerated medication well, without any issues.

## 2022-06-02 NOTE — Progress Notes (Signed)
Patient came up to the nurses station requesting Nicorette gum. This Probation officer asked patient if he was going to take his scheduled evening medication and he stated "I'll take whatever, I'm through arguing". Patient tolerated medication well, without any issues. Patient came back to the medication room and stated that he threw away the gum because it left a spicy taste in his throat. Patient is now asking for another Nicotine patch,in which this writer stated that he would not be able to get another patch until tomorrow morning. Patient states "that's fine" and walked off.

## 2022-06-02 NOTE — Progress Notes (Signed)
Patient mentioned earlier that he thinks his fiance has left him. This Probation officer spoke to [place], patient's fiance, about forty-five minutes ago. She asked if the message could be passed along to patient that she has not left him. This writer went to pass along the message to patient and he stated that he does not want to make anymore phone calls until he gets his phone, "once I get my phone, then I'll make some calls".

## 2022-06-02 NOTE — Progress Notes (Signed)
Patient is finally sleeping, after receiving two different PRN medications for agitation/anxiety.

## 2022-06-02 NOTE — Progress Notes (Signed)
Patient came up to nurses station stating that he needs something to help calm his nerves, "so I don't lose my shit". Patient proceeds to state that he wears glasses, that have a sharp edge, "if I were to take this off, I could easily go and stab somebody if I wanted to. I'm not going to do that, but if I were as psychotic as they say, it's not smart to have me around all of these people. Is that man working again tonight, I want to see if he's going to shove me". This Probation officer stated to patient that it's not clear who or what he's talking about, but it's not advised for him to try and provoke anyone just to get a reaction. Patient was given PRN Vistaril, Nicorette, and Zyprexa to help with his nerves. Patient tolerated medication administration well, without any issues. Patient remains safe on the unit.

## 2022-06-02 NOTE — [facility] Group Notes (Signed)
[place] Group Notes:  (Nursing/MHT/Case Management/Adjunct)  Date:  06/02/2022  Time:  10:59 AM  Type of Therapy:   community meeting  Participation Level:  Did Not Attend    Antonieta Pert 06/02/2022, 10:59 AM

## 2022-06-02 NOTE — Progress Notes (Signed)
Patient just came to the nurses station asking to call the police. This Probation officer asked patient what did he need to call the police for and he stated "that's none of your concern, either I can call them or you can call them. I just need the police to come here".

## 2022-06-02 NOTE — Progress Notes (Signed)
[facility] MD Progress Note  06/02/2022 12:20 PM Matthew Gonzales  MRN:  [id number] Subjective: Follow-up patient was having a manic spell.  Patient seen and chart reviewed.  Also spoke with his fiance.  Fiance reports that everything about his current presentation is unlike how he normally is.  She adds information confirming the likelihood of mania such as that the patient had not slept very many hours at all in the week prior to presentation and that he has been calling her making grandiose statements.  Patient is up and pacing today but not hostile although some of his behavior has been unnerving to the staff.  Last night he was aggressive and required intramuscular medications Principal Problem: Bipolar I disorder, most recent episode (or current) manic (HCC) Diagnosis: Principal Problem:   Bipolar I disorder, most recent episode (or current) manic (HCC) Active Problems:   Psychosis (HCC)   Cannabis abuse   Hypertension  Total Time spent with patient: 30 minutes  Past Psychiatric History: Past history minimal not a lot of information although fianc tells me that she has long thought that he had mood symptoms  Past Medical History:  Past Medical History:  Diagnosis Date   Epidermal inclusion cyst    HSV-2 (herpes simplex virus 2) infection     Past Surgical History:  Procedure Laterality Date   CYST EXCISION  05/09/15   Epidermal Inclusion Cyst- Back (Dr. Orvis Brill)   vertical sleeve gastrectomy     Family History:  Family History  Problem Relation Age of Onset   Hypothyroidism Mother    Hyperlipidemia Father    Pancreatic cancer Maternal Grandmother    Stroke Paternal Grandmother    Cataracts Paternal Grandfather    Family Psychiatric  History: See previous Social History:  Social History   Substance and Sexual Activity  Alcohol Use Yes   Alcohol/week: 0.0 standard drinks of alcohol   Comment: rarely     Social History   Substance and Sexual Activity  Drug Use Yes    Types: Marijuana   Comment: daily cannabis use    Social History   Socioeconomic History   Marital status: Single    Spouse name: Not on file   Number of children: Not on file   Years of education: Not on file   Highest education level: Not on file  Occupational History   Not on file  Tobacco Use   Smoking status: Former    Packs/day: 0.50    Types: Cigarettes   Smokeless tobacco: Never  Vaping Use   Vaping Use: Every day  Substance and Sexual Activity   Alcohol use: Yes    Alcohol/week: 0.0 standard drinks of alcohol    Comment: rarely   Drug use: Yes    Types: Marijuana    Comment: daily cannabis use   Sexual activity: Yes  Other Topics Concern   Not on file  Social History Narrative   Not on file   Social Determinants of Health   Financial Resource Strain: Not on file  Food Insecurity: No Food Insecurity (05/29/2022)   Hunger Vital Sign    Worried About Running Out of Food in the Last Year: Never true    Ran Out of Food in the Last Year: Never true  Transportation Needs: No Transportation Needs (05/29/2022)   PRAPARE - Administrator, Civil Service (Medical): No    Lack of Transportation (Non-Medical): No  Physical Activity: Not on file  Stress: Not on file  Social Connections: Not  on file   Additional Social History:                         Sleep: Poor  Appetite:  Fair  Current Medications: Current Facility-Administered Medications  Medication Dose Route Frequency Provider Last Rate Last Admin   acyclovir (ZOVIRAX) 200 MG capsule 400 mg  400 mg Oral BID Caroline Sauger, NP   400 mg at 06/02/22 0803   alum & mag hydroxide-simeth (MAALOX/MYLANTA) 200-200-20 MG/5ML suspension 30 mL  30 mL Oral Q4H PRN Caroline Sauger, NP       atorvastatin (LIPITOR) tablet 40 mg  40 mg Oral Daily Caroline Sauger, NP   40 mg at 06/02/22 0804   hydrOXYzine (ATARAX) tablet 50 mg  50 mg Oral Q6H PRN Riyan Haile, Madie Reno, MD   50 mg at 06/02/22  0804   ibuprofen (ADVIL) tablet 800 mg  800 mg Oral Q12H PRN Caroline Sauger, NP   800 mg at 06/02/22 0803   lisinopril (ZESTRIL) tablet 10 mg  10 mg Oral Daily Marg Macmaster, Madie Reno, MD   10 mg at 06/02/22 0803   lithium carbonate (LITHOBID) ER tablet 300 mg  300 mg Oral Q12H Torre Schaumburg, Madie Reno, MD   300 mg at 06/02/22 1101   OLANZapine zydis (ZYPREXA) disintegrating tablet 10 mg  10 mg Oral Q8H PRN Milbern Doescher, Madie Reno, MD       And   LORazepam (ATIVAN) tablet 1 mg  1 mg Oral PRN Derin Granquist, Madie Reno, MD       magnesium hydroxide (MILK OF MAGNESIA) suspension 30 mL  30 mL Oral Daily PRN Caroline Sauger, NP       metaxalone (SKELAXIN) tablet 800 mg  800 mg Oral TID PRN Caroline Sauger, NP       nicotine (NICODERM CQ - dosed in mg/24 hours) patch 14 mg  14 mg Transdermal Daily Tykira Wachs, Madie Reno, MD   14 mg at 06/02/22 1610   nicotine polacrilex (NICORETTE) gum 2 mg  2 mg Oral PRN Tanikka Bresnan, Madie Reno, MD   2 mg at 06/01/22 2017   QUEtiapine (SEROQUEL) tablet 400 mg  400 mg Oral QHS Sarayu Prevost T, MD       risperiDONE (RISPERDAL M-TABS) disintegrating tablet 2 mg  2 mg Oral Q8H PRN Fortino Haag T, MD   2 mg at 06/01/22 2131   ziprasidone (GEODON) injection 20 mg  20 mg Intramuscular Q12H PRN Kyrel Leighton, Madie Reno, MD   20 mg at 06/01/22 1951    Lab Results: No results found for this or any previous visit (from the past 46 hour(s)).  Blood Alcohol level:  Lab Results  Component Value Date   ETH <10 96/08/5407    Metabolic Disorder Labs: Lab Results  Component Value Date   HGBA1C 5.1 05/31/2022   MPG 99.67 05/31/2022   No results found for: "PROLACTIN" Lab Results  Component Value Date   CHOL 138 05/31/2022   TRIG 112 05/31/2022   HDL 28 (L) 05/31/2022   CHOLHDL 4.9 05/31/2022   VLDL 22 05/31/2022   LDLCALC 88 05/31/2022    Physical Findings: AIMS:  , ,  ,  ,    CIWA:    COWS:     Musculoskeletal: Strength & Muscle Tone: within normal limits Gait & Station: normal Patient leans:  N/A  Psychiatric Specialty Exam:  Presentation  General Appearance:  Bizarre  Eye Contact: Good  Speech: Clear and Coherent  Speech Volume: Normal  Handedness:  Right   Mood and Affect  Mood: Dysphoric  Affect: Inappropriate   Thought Process  Thought Processes: Disorganized; Irrevelant  Descriptions of Associations:Loose  Orientation:Partial  Thought Content:Illogical  History of Schizophrenia/Schizoaffective disorder:No  Duration of Psychotic Symptoms:Less than six months  Hallucinations:No data recorded Ideas of Reference:Paranoia  Suicidal Thoughts:No data recorded Homicidal Thoughts:No data recorded  Sensorium  Memory: Immediate Poor  Judgment: Impaired  Insight: Poor   Executive Functions  Concentration: Poor  Attention Span: Poor  Recall: Poor  Fund of Knowledge: Poor  Language: Poor   Psychomotor Activity  Psychomotor Activity:No data recorded  Assets  Assets: Desire for Improvement; Housing; Physical Health; Resilience; Social Support   Sleep  Sleep:No data recorded   Physical Exam: Physical Exam Vitals and nursing note reviewed.  Constitutional:      Appearance: Normal appearance.  HENT:     Head: Normocephalic and atraumatic.     Mouth/Throat:     Pharynx: Oropharynx is clear.  Eyes:     Pupils: Pupils are equal, round, and reactive to light.  Cardiovascular:     Rate and Rhythm: Normal rate and regular rhythm.  Pulmonary:     Effort: Pulmonary effort is normal.     Breath sounds: Normal breath sounds.  Abdominal:     General: Abdomen is flat.     Palpations: Abdomen is soft.  Musculoskeletal:        General: Normal range of motion.  Skin:    General: Skin is warm and dry.  Neurological:     General: No focal deficit present.     Mental Status: He is alert. Mental status is at baseline.  Psychiatric:        Attention and Perception: He is inattentive.        Mood and Affect: Affect is labile  and inappropriate.        Speech: Speech is tangential.        Behavior: Behavior is agitated.        Thought Content: Thought content is paranoid.        Cognition and Memory: Memory is impaired.    Review of Systems  Constitutional: Negative.   HENT: Negative.    Eyes: Negative.   Respiratory: Negative.    Cardiovascular: Negative.   Gastrointestinal: Negative.   Musculoskeletal: Negative.   Skin: Negative.   Neurological: Negative.   Psychiatric/Behavioral:  Negative for suicidal ideas. The patient has insomnia.    Blood pressure (!) 140/80, pulse (!) 117, temperature 98.2 F (36.8 C), temperature source Oral, resp. rate 18, height 5\' 10"  (1.778 m), weight 95.3 kg, SpO2 100 %. Body mass index is 30.15 kg/m.   Treatment Plan Summary: Medication management and Plan reviewed medication and plan.  Also spoke with his fiance and did some psychoeducation.  Increase Seroquel to 400 mg at night and lithium 300 mg twice a day.  Patient agreeable.  Alethia Berthold, MD 06/02/2022, 12:20 PM

## 2022-06-02 NOTE — Progress Notes (Signed)
Patient just came back up to the nurses station asking if the police has shown up. This Probation officer stated that the police aren't coming to see him and he asked why not. This Probation officer stated because he is in the hospital for mental evaluation. Patient then states "I'm not a danger to myself, I'm ready to go. I need to talk to someone right now, so I can go. I'm not eating here, sleeping here another night and I'm not taking anymore medicine. Don't come get me for anymore medicine".

## 2022-06-02 NOTE — Progress Notes (Signed)
Patient refused to take his scheduled evening medication, stating he's not taking anything else while he's here because he is voluntary and can sign himself out. This Probation officer tried to explain to patient that he can only be released once the doctor puts in discharge orders. However, this was not receptive to patient and he stated that he will start acting out if he doesn't leave. Patient also stated that he will start punching walls, ect. So he can leave.

## 2022-06-03 MED ORDER — LITHIUM CARBONATE ER 450 MG PO TBCR
450.0000 mg | EXTENDED_RELEASE_TABLET | Freq: Two times a day (BID) | ORAL | Status: DC
  Administered 2022-06-03 – 2022-06-06 (×5): 450 mg via ORAL
  Filled 2022-06-03 (×5): qty 1

## 2022-06-03 MED ORDER — SENNOSIDES-DOCUSATE SODIUM 8.6-50 MG PO TABS
2.0000 | ORAL_TABLET | Freq: Every day | ORAL | Status: DC | PRN
  Administered 2022-06-03: 2 via ORAL
  Filled 2022-06-03: qty 2

## 2022-06-03 MED ORDER — ACETAMINOPHEN 325 MG PO TABS
650.0000 mg | ORAL_TABLET | Freq: Four times a day (QID) | ORAL | Status: DC | PRN
  Administered 2022-06-03 – 2022-06-09 (×3): 650 mg via ORAL
  Filled 2022-06-03 (×4): qty 2

## 2022-06-03 MED ORDER — DOCUSATE SODIUM 100 MG PO CAPS
100.0000 mg | ORAL_CAPSULE | Freq: Two times a day (BID) | ORAL | Status: DC
  Administered 2022-06-03 – 2022-06-09 (×9): 100 mg via ORAL
  Filled 2022-06-03 (×12): qty 1

## 2022-06-03 NOTE — [facility] Group Notes (Signed)
[place] Group Notes:  (Nursing/MHT/Case Management/Adjunct)  Date:  06/03/2022  Time:  9:21 PM  Type of Therapy:   Wrap up  Participation Level:  Active  Participation Quality:  Appropriate  Affect:  Appropriate  Cognitive:  Alert  Insight:  Good  Engagement in Group:  Engaged and his goal is to make better assessment  Modes of Intervention:  Support  Summary of Progress/Problems:  Nehemiah Settle 06/03/2022, 9:21 PM

## 2022-06-03 NOTE — Progress Notes (Signed)
Recreation Therapy Notes    Date: 06/03/2022  Time: 10:30 am  Location: Craft room     Behavioral response: N/A   Intervention Topic: Time Management   Discussion/Intervention: Patient refused to attend group.   Clinical Observations/Feedback:  Patient refused to attend group.    Matthew Gonzales LRT/CTRS        Matthew Gonzales 06/03/2022 1:37 PM

## 2022-06-03 NOTE — Plan of Care (Signed)
  Problem: Education: Goal: Knowledge of General Education information will improve Description: Including pain rating scale, medication(s)/side effects and non-pharmacologic comfort measures Outcome: Progressing   Problem: Health Behavior/Discharge Planning: Goal: Ability to manage health-related needs will improve Outcome: Progressing   Problem: Clinical Measurements: Goal: Ability to maintain clinical measurements within normal limits will improve Outcome: Progressing Goal: Will remain free from infection Outcome: Progressing Goal: Diagnostic test results will improve Outcome: Progressing Goal: Respiratory complications will improve Outcome: Progressing Goal: Cardiovascular complication will be avoided Outcome: Progressing   Problem: Safety: Goal: Periods of time without injury will increase Outcome: Progressing   Problem: Physical Regulation: Goal: Ability to maintain clinical measurements within normal limits will improve Outcome: Progressing   Problem: Health Behavior/Discharge Planning: Goal: Identification of resources available to assist in meeting health care needs will improve Outcome: Progressing Goal: Compliance with treatment plan for underlying cause of condition will improve Outcome: Progressing   Problem: Coping: Goal: Ability to verbalize frustrations and anger appropriately will improve Outcome: Progressing Goal: Ability to demonstrate self-control will improve Outcome: Progressing   Problem: Activity: Goal: Interest or engagement in activities will improve Outcome: Progressing Goal: Sleeping patterns will improve Outcome: Progressing   Problem: Education: Goal: Knowledge of  General Education information/materials will improve Outcome: Progressing Goal: Emotional status will improve Outcome: Progressing Goal: Mental status will improve Outcome: Progressing Goal: Verbalization of understanding the information provided will  improve Outcome: Progressing   Problem: Skin Integrity: Goal: Risk for impaired skin integrity will decrease Outcome: Progressing   Problem: Safety: Goal: Ability to remain free from injury will improve Outcome: Progressing

## 2022-06-03 NOTE — Progress Notes (Signed)
Patient requesting either nicotine gum or patch at this encounter. This staff informed him that he can only get gum at this time. He then asked if he could get his night medication so he could go to bed. Staff explained again for the second night in a row that the nicotine patch is a 24hr patch and will only be given once a day.  Advised him not to throw it away if he felt like he was going to be needing it again. Patient seemed receptive to the conversation.  He received  his qhs medication without incident. Denies si/hi/avh depression and anxiety at his encounter. Continues to have labile mood. Q 15 minute safety checks in place.    Genia Del, LPN

## 2022-06-03 NOTE — Progress Notes (Signed)
[facility] MD Progress Note  06/03/2022 10:46 AM Matthew Gonzales  MRN:  [id number] Subjective: Follow-up 32 year old man with mania.  Patient continues to be pacing a lot inappropriate odd affect talking to himself frequently somewhat agitated.  Family continues to report that he is not behaving normally.  Denies suicidal thoughts.  So far taking medicine. Principal Problem: Bipolar I disorder, most recent episode (or current) manic (HCC) Diagnosis: Principal Problem:   Bipolar I disorder, most recent episode (or current) manic (HCC) Active Problems:   Psychosis (HCC)   Cannabis abuse   Hypertension  Total Time spent with patient: 30 minutes  Past Psychiatric History: Past history of no major mental health problems prior to this event  Past Medical History:  Past Medical History:  Diagnosis Date   Epidermal inclusion cyst    HSV-2 (herpes simplex virus 2) infection     Past Surgical History:  Procedure Laterality Date   CYST EXCISION  05/09/15   Epidermal Inclusion Cyst- Back (Dr. Orvis Brill)   vertical sleeve gastrectomy     Family History:  Family History  Problem Relation Age of Onset   Hypothyroidism Mother    Hyperlipidemia Father    Pancreatic cancer Maternal Grandmother    Stroke Paternal Grandmother    Cataracts Paternal Grandfather    Family Psychiatric  History: See previous Social History:  Social History   Substance and Sexual Activity  Alcohol Use Yes   Alcohol/week: 0.0 standard drinks of alcohol   Comment: rarely     Social History   Substance and Sexual Activity  Drug Use Yes   Types: Marijuana   Comment: daily cannabis use    Social History   Socioeconomic History   Marital status: Single    Spouse name: Not on file   Number of children: Not on file   Years of education: Not on file   Highest education level: Not on file  Occupational History   Not on file  Tobacco Use   Smoking status: Former    Packs/day: 0.50    Types: Cigarettes   Smokeless  tobacco: Never  Vaping Use   Vaping Use: Every day  Substance and Sexual Activity   Alcohol use: Yes    Alcohol/week: 0.0 standard drinks of alcohol    Comment: rarely   Drug use: Yes    Types: Marijuana    Comment: daily cannabis use   Sexual activity: Yes  Other Topics Concern   Not on file  Social History Narrative   Not on file   Social Determinants of Health   Financial Resource Strain: Not on file  Food Insecurity: No Food Insecurity (05/29/2022)   Hunger Vital Sign    Worried About Running Out of Food in the Last Year: Never true    Ran Out of Food in the Last Year: Never true  Transportation Needs: No Transportation Needs (05/29/2022)   PRAPARE - Administrator, Civil Service (Medical): No    Lack of Transportation (Non-Medical): No  Physical Activity: Not on file  Stress: Not on file  Social Connections: Not on file   Additional Social History:                         Sleep: Fair  Appetite:  Fair  Current Medications: Current Facility-Administered Medications  Medication Dose Route Frequency Provider Last Rate Last Admin   acyclovir (ZOVIRAX) 200 MG capsule 400 mg  400 mg Oral BID Gillermo Murdoch, NP  400 mg at 06/03/22 0851   alum & mag hydroxide-simeth (MAALOX/MYLANTA) 200-200-20 MG/5ML suspension 30 mL  30 mL Oral Q4H PRN Caroline Sauger, NP       atorvastatin (LIPITOR) tablet 40 mg  40 mg Oral Daily Caroline Sauger, NP   40 mg at 06/03/22 0851   hydrOXYzine (ATARAX) tablet 50 mg  50 mg Oral Q6H PRN Romulus Hanrahan, Madie Reno, MD   50 mg at 06/03/22 0851   ibuprofen (ADVIL) tablet 800 mg  800 mg Oral Q12H PRN Caroline Sauger, NP   800 mg at 06/03/22 0953   lisinopril (ZESTRIL) tablet 10 mg  10 mg Oral Daily Sherisse Fullilove, Madie Reno, MD   10 mg at 06/03/22 0851   lithium carbonate (ESKALITH) ER tablet 450 mg  450 mg Oral Q12H Lasasha Brophy T, MD       magnesium hydroxide (MILK OF MAGNESIA) suspension 30 mL  30 mL Oral Daily PRN Caroline Sauger, NP   30 mL at 06/03/22 1029   metaxalone (SKELAXIN) tablet 800 mg  800 mg Oral TID PRN Caroline Sauger, NP       nicotine (NICODERM CQ - dosed in mg/24 hours) patch 14 mg  14 mg Transdermal Daily Zeta Bucy, Madie Reno, MD   14 mg at 06/03/22 1884   nicotine polacrilex (NICORETTE) gum 2 mg  2 mg Oral PRN Kyrian Stage, Madie Reno, MD   2 mg at 06/02/22 2046   OLANZapine zydis (ZYPREXA) disintegrating tablet 10 mg  10 mg Oral Q8H PRN Rekia Kujala T, MD   10 mg at 06/02/22 1401   QUEtiapine (SEROQUEL) tablet 400 mg  400 mg Oral QHS Devyn Sheerin T, MD   400 mg at 06/02/22 2048   risperiDONE (RISPERDAL M-TABS) disintegrating tablet 2 mg  2 mg Oral Q8H PRN Winter Trefz, Madie Reno, MD   2 mg at 06/02/22 2046   ziprasidone (GEODON) injection 20 mg  20 mg Intramuscular Q12H PRN Elizabelle Fite, Madie Reno, MD   20 mg at 06/01/22 1951    Lab Results: No results found for this or any previous visit (from the past 70 hour(s)).  Blood Alcohol level:  Lab Results  Component Value Date   ETH <10 16/60/6301    Metabolic Disorder Labs: Lab Results  Component Value Date   HGBA1C 5.1 05/31/2022   MPG 99.67 05/31/2022   No results found for: "PROLACTIN" Lab Results  Component Value Date   CHOL 138 05/31/2022   TRIG 112 05/31/2022   HDL 28 (L) 05/31/2022   CHOLHDL 4.9 05/31/2022   VLDL 22 05/31/2022   LDLCALC 88 05/31/2022    Physical Findings: AIMS:  , ,  ,  ,    CIWA:    COWS:     Musculoskeletal: Strength & Muscle Tone: within normal limits Gait & Station: normal Patient leans: N/A  Psychiatric Specialty Exam:  Presentation  General Appearance:  Bizarre  Eye Contact: Good  Speech: Clear and Coherent  Speech Volume: Normal  Handedness: Right   Mood and Affect  Mood: Dysphoric  Affect: Inappropriate   Thought Process  Thought Processes: Disorganized; Irrevelant  Descriptions of Associations:Loose  Orientation:Partial  Thought Content:Illogical  History of  Schizophrenia/Schizoaffective disorder:No  Duration of Psychotic Symptoms:Less than six months  Hallucinations:No data recorded Ideas of Reference:Paranoia  Suicidal Thoughts:No data recorded Homicidal Thoughts:No data recorded  Sensorium  Memory: Immediate Poor  Judgment: Impaired  Insight: Poor   Executive Functions  Concentration: Poor  Attention Span: Poor  Recall: Poor  Fund of Knowledge:  Poor  Language: Poor   Psychomotor Activity  Psychomotor Activity:No data recorded  Assets  Assets: Desire for Improvement; Housing; Physical Health; Resilience; Social Support   Sleep  Sleep:No data recorded   Physical Exam: Physical Exam Vitals and nursing note reviewed.  Constitutional:      Appearance: Normal appearance.  HENT:     Head: Normocephalic and atraumatic.     Mouth/Throat:     Pharynx: Oropharynx is clear.  Eyes:     Pupils: Pupils are equal, round, and reactive to light.  Cardiovascular:     Rate and Rhythm: Normal rate and regular rhythm.  Pulmonary:     Effort: Pulmonary effort is normal.     Breath sounds: Normal breath sounds.  Abdominal:     General: Abdomen is flat.     Palpations: Abdomen is soft.  Musculoskeletal:        General: Normal range of motion.  Skin:    General: Skin is warm and dry.  Neurological:     General: No focal deficit present.     Mental Status: He is alert. Mental status is at baseline.  Psychiatric:        Attention and Perception: Attention normal.        Mood and Affect: Mood normal. Affect is labile and inappropriate.        Speech: Speech is tangential.        Behavior: Behavior is cooperative.        Thought Content: Thought content normal.        Judgment: Judgment is impulsive.    Review of Systems  Constitutional: Negative.   HENT: Negative.    Eyes: Negative.   Respiratory: Negative.    Cardiovascular: Negative.   Gastrointestinal: Negative.   Musculoskeletal: Negative.   Skin:  Negative.   Neurological: Negative.   Psychiatric/Behavioral: Negative.     Blood pressure 127/89, pulse 79, temperature 98.2 F (36.8 C), temperature source Oral, resp. rate 18, height 5\' 10"  (1.778 m), weight 95.3 kg, SpO2 100 %. Body mass index is 30.15 kg/m.   Treatment Plan Summary: Plan increase lithium to 450 mg twice a day.  Psychoeducation and supportive counseling with patient.  Ongoing assessment daily by full professional staff.  Alethia Berthold, MD 06/03/2022, 10:46 AM

## 2022-06-03 NOTE — [facility] Counselor (Signed)
CSW was approached by patient following the conclusion of group around 2PM.   Patient asked CSW if group was open to everyone.  CSW replied that it was.  CSW took several steps back as patient was in personal space.  Patient then pointed finger at [place] stating "Don't walk away from me. I am talking to you.  Who do you think you are?"  Patient was aggressively posturing and pointing finger in this CSW face. Patient utilized expletives.  CSW explained that this writer would not be spoken to in this matter and stepped back again.  CSW exited conversation without incident.  Patient was observed to be circling the nurses station shouting, aggressively gesturing, using expletives and making threatening comments of how he will seek this writer out daily.    Patient was unable to be redirected via staff support.   Psychiatrist was observed stating that patient may need PRN medications for de-escalation.   CSW remained in nurses station to continue paperwork and tasks with no further interaction or incident from patient.  CSW did observed patient to deescalate and rejoin the patient milieu.  Assunta Curtis, MSW, LCSW 06/03/2022 3:57 PM

## 2022-06-03 NOTE — Plan of Care (Signed)
Patient requested anxiety medicine with his AM medications.Then patient was appropriate till this afternoon. Patient got verbally aggressive with the SW. Patient pacing around the nurses station and shouting at the staff. Patient states " I don't feel myself."  Accepted PRN medications. Appetite and energy level good. Compliant with medications. Visible in the milieu. Support and encouragement given.

## 2022-06-03 NOTE — Group Note (Signed)
[facility] LCSW Group Therapy Note    Group Date: 06/03/2022 Start Time: 1300 End Time: 1400  Type of Therapy and Topic:  Group Therapy:  Overcoming Obstacles  Participation Level:  [facility] PARTICIPATION LEVEL: Did Not Attend  Mood:  Description of Group:   In this group patients will be encouraged to explore what they see as obstacles to their own wellness and recovery. They will be guided to discuss their thoughts, feelings, and behaviors related to these obstacles. The group will process together ways to cope with barriers, with attention given to specific choices patients can make. Each patient will be challenged to identify changes they are motivated to make in order to overcome their obstacles. This group will be process-oriented, with patients participating in exploration of their own experiences as well as giving and receiving support and challenge from other group members.  Therapeutic Goals: 1. Patient will identify personal and current obstacles as they relate to admission. 2. Patient will identify barriers that currently interfere with their wellness or overcoming obstacles.  3. Patient will identify feelings, thought process and behaviors related to these barriers. 4. Patient will identify two changes they are willing to make to overcome these obstacles:    Summary of Patient Progress   X   Therapeutic Modalities:   Cognitive Behavioral Therapy Solution Focused Therapy Motivational Interviewing Relapse Prevention Therapy   Annia Gomm J Lusero Nordlund, LCSW 

## 2022-06-04 NOTE — Group Note (Signed)
[facility] LCSW Group Therapy Note   Group Date: 06/04/2022 Start Time: 1300 End Time: 1400  Type of Therapy/Topic:  Group Therapy:  Feelings about Diagnosis  Participation Level:  Active    Description of Group:    This group will allow patients to explore their thoughts and feelings about diagnoses they have received. Patients will be guided to explore their level of understanding and acceptance of these diagnoses. Facilitator will encourage patients to process their thoughts and feelings about the reactions of others to their diagnosis, and will guide patients in identifying ways to discuss their diagnosis with significant others in their lives. This group will be process-oriented, with patients participating in exploration of their own experiences as well as giving and receiving support and challenge from other group members.   Therapeutic Goals: 1. Patient will demonstrate understanding of diagnosis as evidence by identifying two or more symptoms of the disorder:  2. Patient will be able to express two feelings regarding the diagnosis 3. Patient will demonstrate ability to communicate their needs through discussion and/or role plays  Summary of Patient Progress: Patient was present for the majority of the group process. He shares that he does not agree with his diagnosis and that he believes that he just has some anger issues and anxiety. Pt states that his support system/family have taken his diagnosis badly and that his mother sent him here. He shares that he is just here until he can be released, being held against his will.   Therapeutic Modalities:   Cognitive Behavioral Therapy Brief Therapy Feelings Identification    Shirl Harris, LCSW

## 2022-06-04 NOTE — Progress Notes (Signed)
Recreation Therapy Notes    Date: 06/04/2022  Time: 9:50 am  Location: Courtyard     Behavioral response: N/A   Intervention Topic: Leisure   Discussion/Intervention: Patient refused to attend group.   Clinical Observations/Feedback:  Patient refused to attend group.    Illya Gienger LRT/CTRS        [place] 06/04/2022 12:32 PM 

## 2022-06-04 NOTE — Progress Notes (Signed)
Patient's fiancee Nori Riis called regarding wanting an update on patient.  Also she gave the fax number to the court that the attestation that he is in the hospital since court date is 1/25.  Joylene Igo is [phone] Information secure messaged to CSW as well.

## 2022-06-04 NOTE — Plan of Care (Signed)
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 I don't have any problem with my sleep here." Patient pacing in the milieu most of the day. No aggressive behaviors noted. Denies SI,HI and AVH. Appetite and energy level good. Compliant with medications. Support and encouragement given.

## 2022-06-04 NOTE — Progress Notes (Signed)
   06/03/22 1946  Psych Admission Type (Psych Patients Only)  Admission Status Involuntary  Psychosocial Assessment  Patient Complaints Anxiety;Irritability  Eye Contact Fair  Facial Expression Other (Comment)  Affect Appropriate to circumstance  Speech Logical/coherent  Interaction Assertive  Motor Activity Slow  Appearance/Hygiene Unremarkable  Behavior Characteristics Cooperative;Anxious  Mood Anxious;Labile  Thought Process  Coherency WDL  Content WDL  Delusions None reported or observed  Perception WDL  Hallucination None reported or observed  Judgment Impaired  Confusion None  Danger to Self  Current suicidal ideation?  (Denies)  Danger to Others  Danger to Others None reported or observed   Patient c/o feeling agitated and anxious at HS pacing in his room and hallway prn Zyprexa and vistaril administered no adverse reaction noted. Q 15 minutes safety checks ongoing. Patient denies SI/HI/A/VH and verbally contracted for safety. Support and encouragement provided.

## 2022-06-04 NOTE — Progress Notes (Signed)
[facility] MD Progress Note  06/04/2022 10:33 AM Matthew Gonzales  MRN:  [id number] Subjective: Patient seen for follow-up.  Patient continues to pace around the unit much of the day although reportedly he slept better last night probably thanks to medication.  He came to my office today to talk to me and told me he felt fidgety like he could not sit still but he was able to sit still during the time we spoke.  He has rapid pressured speech with tangential thinking and flight of ideas.  Makes grandiose statements multiple times.  Talks to himself a lot.  Makes some violent comments although he makes sure to reassure me that he has no intention of acting on them just that he "could" if he wanted to.  In other words he still looks manic. Principal Problem: Bipolar I disorder, most recent episode (or current) manic ([place]) Diagnosis: Principal Problem:   Bipolar I disorder, most recent episode (or current) manic ([place]) Active Problems:   Psychosis ([place])   Cannabis abuse   Hypertension  Total Time spent with patient: 30 minutes  Past Psychiatric History: Past history of known diagnosed mood treatment although speaking with his fiance she says that she has "always" thought that he had bipolar disorder.  Past Medical History:  Past Medical History:  Diagnosis Date   Epidermal inclusion cyst    HSV-2 (herpes simplex virus 2) infection     Past Surgical History:  Procedure Laterality Date   CYST EXCISION  05/09/15   Epidermal Inclusion Cyst- Back (Dr. Azalee Course)   vertical sleeve gastrectomy     Family History:  Family History  Problem Relation Age of Onset   Hypothyroidism Mother    Hyperlipidemia Father    Pancreatic cancer Maternal Grandmother    Stroke Paternal Grandmother    Cataracts Paternal Grandfather    Family Psychiatric  History: None reported Social History:  Social History   Substance and Sexual Activity  Alcohol Use Yes   Alcohol/week: 0.0 standard drinks of alcohol   Comment:  rarely     Social History   Substance and Sexual Activity  Drug Use Yes   Types: Marijuana   Comment: daily cannabis use    Social History   Socioeconomic History   Marital status: Single    Spouse name: Not on file   Number of children: Not on file   Years of education: Not on file   Highest education level: Not on file  Occupational History   Not on file  Tobacco Use   Smoking status: Former    Packs/day: 0.50    Types: Cigarettes   Smokeless tobacco: Never  Vaping Use   Vaping Use: Every day  Substance and Sexual Activity   Alcohol use: Yes    Alcohol/week: 0.0 standard drinks of alcohol    Comment: rarely   Drug use: Yes    Types: Marijuana    Comment: daily cannabis use   Sexual activity: Yes  Other Topics Concern   Not on file  Social History Narrative   Not on file   Social Determinants of Health   Financial Resource Strain: Not on file  Food Insecurity: No Food Insecurity (05/29/2022)   Hunger Vital Sign    Worried About Running Out of Food in the Last Year: Never true    Ran Out of Food in the Last Year: Never true  Transportation Needs: No Transportation Needs (05/29/2022)   PRAPARE - Hydrologist (Medical): No  Lack of Transportation (Non-Medical): No  Physical Activity: Not on file  Stress: Not on file  Social Connections: Not on file   Additional Social History:                         Sleep: Fair  Appetite:  Fair  Current Medications: Current Facility-Administered Medications  Medication Dose Route Frequency Provider Last Rate Last Admin   acetaminophen (TYLENOL) tablet 650 mg  650 mg Oral Q6H PRN Genae Strine T, MD   650 mg at 06/03/22 1619   acyclovir (ZOVIRAX) 200 MG capsule 400 mg  400 mg Oral BID Caroline Sauger, NP   400 mg at 06/04/22 0745   alum & mag hydroxide-simeth (MAALOX/MYLANTA) 200-200-20 MG/5ML suspension 30 mL  30 mL Oral Q4H PRN Caroline Sauger, NP       atorvastatin  (LIPITOR) tablet 40 mg  40 mg Oral Daily Caroline Sauger, NP   40 mg at 06/04/22 0730   docusate sodium (COLACE) capsule 100 mg  100 mg Oral BID Graceann Boileau T, MD   100 mg at 06/04/22 0730   hydrOXYzine (ATARAX) tablet 50 mg  50 mg Oral Q6H PRN Janai Maudlin, Madie Reno, MD   50 mg at 06/04/22 0731   ibuprofen (ADVIL) tablet 800 mg  800 mg Oral Q12H PRN Caroline Sauger, NP   800 mg at 06/03/22 2107   lisinopril (ZESTRIL) tablet 10 mg  10 mg Oral Daily Shaneil Yazdi T, MD   10 mg at 06/04/22 0730   lithium carbonate (ESKALITH) ER tablet 450 mg  450 mg Oral Q12H Jermiah Soderman T, MD   450 mg at 06/04/22 0731   magnesium hydroxide (MILK OF MAGNESIA) suspension 30 mL  30 mL Oral Daily PRN Caroline Sauger, NP   30 mL at 06/03/22 1029   metaxalone (SKELAXIN) tablet 800 mg  800 mg Oral TID PRN Caroline Sauger, NP       nicotine (NICODERM CQ - dosed in mg/24 hours) patch 14 mg  14 mg Transdermal Daily Kade Rickels, Madie Reno, MD   14 mg at 06/04/22 6160   nicotine polacrilex (NICORETTE) gum 2 mg  2 mg Oral PRN Balin Vandegrift, Madie Reno, MD   2 mg at 06/04/22 0824   OLANZapine zydis (ZYPREXA) disintegrating tablet 10 mg  10 mg Oral Q8H PRN Ladamien Rammel T, MD   10 mg at 06/03/22 2105   QUEtiapine (SEROQUEL) tablet 400 mg  400 mg Oral QHS Treyshaun Keatts T, MD   400 mg at 06/03/22 2103   risperiDONE (RISPERDAL M-TABS) disintegrating tablet 2 mg  2 mg Oral Q8H PRN Dustina Scoggin, Madie Reno, MD   2 mg at 06/03/22 2105   senna-docusate (Senokot-S) tablet 2 tablet  2 tablet Oral Daily PRN Raman Featherston, Madie Reno, MD   2 tablet at 06/03/22 1118   ziprasidone (GEODON) injection 20 mg  20 mg Intramuscular Q12H PRN Iktan Aikman, Madie Reno, MD   20 mg at 06/01/22 1951    Lab Results: No results found for this or any previous visit (from the past 22 hour(s)).  Blood Alcohol level:  Lab Results  Component Value Date   ETH <10 73/71/0626    Metabolic Disorder Labs: Lab Results  Component Value Date   HGBA1C 5.1 05/31/2022   MPG 99.67  05/31/2022   No results found for: "PROLACTIN" Lab Results  Component Value Date   CHOL 138 05/31/2022   TRIG 112 05/31/2022   HDL 28 (L) 05/31/2022   CHOLHDL  4.9 05/31/2022   VLDL 22 05/31/2022   LDLCALC 88 05/31/2022    Physical Findings: AIMS:  , ,  ,  ,    CIWA:    COWS:     Musculoskeletal: Strength & Muscle Tone: within normal limits Gait & Station: normal Patient leans: N/A  Psychiatric Specialty Exam:  Presentation  General Appearance:  Bizarre  Eye Contact: Good  Speech: Clear and Coherent  Speech Volume: Normal  Handedness: Right   Mood and Affect  Mood: Dysphoric  Affect: Inappropriate   Thought Process  Thought Processes: Disorganized; Irrevelant  Descriptions of Associations:Loose  Orientation:Partial  Thought Content:Illogical  History of Schizophrenia/Schizoaffective disorder:No  Duration of Psychotic Symptoms:Less than six months  Hallucinations:No data recorded Ideas of Reference:Paranoia  Suicidal Thoughts:No data recorded Homicidal Thoughts:No data recorded  Sensorium  Memory: Immediate Poor  Judgment: Impaired  Insight: Poor   Executive Functions  Concentration: Poor  Attention Span: Poor  Recall: Poor  Fund of Knowledge: Poor  Language: Poor   Psychomotor Activity  Psychomotor Activity:No data recorded  Assets  Assets: Desire for Improvement; Housing; Physical Health; Resilience; Social Support   Sleep  Sleep:No data recorded   Physical Exam: Physical Exam Vitals and nursing note reviewed.  Constitutional:      Appearance: Normal appearance.  HENT:     Head: Normocephalic and atraumatic.     Mouth/Throat:     Pharynx: Oropharynx is clear.  Eyes:     Pupils: Pupils are equal, round, and reactive to light.  Cardiovascular:     Rate and Rhythm: Normal rate and regular rhythm.  Pulmonary:     Effort: Pulmonary effort is normal.     Breath sounds: Normal breath sounds.   Abdominal:     General: Abdomen is flat.     Palpations: Abdomen is soft.  Musculoskeletal:        General: Normal range of motion.  Skin:    General: Skin is warm and dry.  Neurological:     General: No focal deficit present.     Mental Status: He is alert. Mental status is at baseline.  Psychiatric:        Attention and Perception: Attention normal.        Mood and Affect: Mood is anxious.        Speech: Speech is rapid and pressured and tangential.        Behavior: Behavior is agitated. Behavior is not aggressive.        Thought Content: Thought content is paranoid.        Cognition and Memory: Memory is impaired.        Judgment: Judgment is impulsive.    Review of Systems  Constitutional: Negative.   HENT: Negative.    Eyes: Negative.   Respiratory: Negative.    Cardiovascular: Negative.   Gastrointestinal: Negative.   Musculoskeletal: Negative.   Skin: Negative.   Neurological: Negative.   Psychiatric/Behavioral:  Negative for depression, hallucinations, substance abuse and suicidal ideas. The patient is nervous/anxious. The patient does not have insomnia.    Blood pressure 119/78, pulse 82, temperature 97.7 F (36.5 C), temperature source Oral, resp. rate 18, height 5\' 10"  (1.778 m), weight 95.3 kg, SpO2 100 %. Body mass index is 30.15 kg/m.   Treatment Plan Summary: Medication management and Plan I increased the dose of lithium yesterday.  Spoke with patient about that.  He had no complaints.  He is now on what should be a therapeutic dose of both Seroquel and  lithium which I hope will start working soon.  He mentioned feeling like he was restless all the time but during our interview he did not look like somebody with akathisia and from his current medicine it is unlikely that that would be an acute issue as Seroquel rarely causes akathisia.  I think he is just manic.  Tried to do some psychoeducation probably with limited effect.  Mordecai Rasmussen, MD 06/04/2022, 10:33  AM

## 2022-06-05 MED ORDER — CLONIDINE HCL 0.1 MG PO TABS: 0.2000 mg | ORAL_TABLET | Freq: Once | ORAL | Status: DC

## 2022-06-05 NOTE — Progress Notes (Addendum)
[facility] LCSW Progress Note   06/05/2022    10:55 AM  Matthew Gonzales   [id number]   Type of Contact and Topic: Case Management   CSW provided patient with letter outlining dates of service. Letter emailed to patient's mother and faxed to [place] w/ written consent by patient.      Signed:  [name], MSW, LCSWA, LCAS 06/05/2022 10:55 AM

## 2022-06-05 NOTE — BH IP Treatment Plan (Signed)
Interdisciplinary Treatment and Diagnostic Plan Update  06/05/2022 Time of Session: 8:30AM Matthew Gonzales MRN: [id number]  Principal Diagnosis: Bipolar I disorder, most recent episode (or current) manic (Matthew Gonzales)  Secondary Diagnoses: Principal Problem:   Bipolar I disorder, most recent episode (or current) manic (Matthew Gonzales) Active Problems:   Psychosis (Matthew Gonzales)   Cannabis abuse   Hypertension   Current Medications:  Current Facility-Administered Medications  Medication Dose Route Frequency Provider Last Rate Last Admin   acetaminophen (TYLENOL) tablet 650 mg  650 mg Oral Q6H PRN Clapacs, John T, MD   650 mg at 06/03/22 1619   acyclovir (ZOVIRAX) 200 MG capsule 400 mg  400 mg Oral BID Caroline Sauger, NP   400 mg at 06/05/22 0823   alum & mag hydroxide-simeth (MAALOX/MYLANTA) 200-200-20 MG/5ML suspension 30 mL  30 mL Oral Q4H PRN Caroline Sauger, NP       atorvastatin (LIPITOR) tablet 40 mg  40 mg Oral Daily Caroline Sauger, NP   40 mg at 06/05/22 0824   docusate sodium (COLACE) capsule 100 mg  100 mg Oral BID Clapacs, John T, MD   100 mg at 06/05/22 0824   hydrOXYzine (ATARAX) tablet 50 mg  50 mg Oral Q6H PRN Clapacs, John T, MD   50 mg at 06/04/22 1846   ibuprofen (ADVIL) tablet 800 mg  800 mg Oral Q12H PRN Caroline Sauger, NP   800 mg at 06/04/22 2152   lisinopril (ZESTRIL) tablet 10 mg  10 mg Oral Daily Clapacs, Madie Reno, MD   10 mg at 06/05/22 0824   lithium carbonate (ESKALITH) ER tablet 450 mg  450 mg Oral Q12H Clapacs, John T, MD   450 mg at 06/05/22 0825   magnesium hydroxide (MILK OF MAGNESIA) suspension 30 mL  30 mL Oral Daily PRN Caroline Sauger, NP   30 mL at 06/03/22 1029   metaxalone (SKELAXIN) tablet 800 mg  800 mg Oral TID PRN Caroline Sauger, NP       nicotine (NICODERM CQ - dosed in mg/24 hours) patch 14 mg  14 mg Transdermal Daily Clapacs, Madie Reno, MD   14 mg at 06/05/22 6433   nicotine polacrilex (NICORETTE) gum 2 mg  2 mg Oral PRN Clapacs, John T, MD    2 mg at 06/05/22 1014   OLANZapine zydis (ZYPREXA) disintegrating tablet 10 mg  10 mg Oral Q8H PRN Clapacs, John T, MD   10 mg at 06/03/22 2105   QUEtiapine (SEROQUEL) tablet 400 mg  400 mg Oral QHS Clapacs, John T, MD   400 mg at 06/04/22 2153   risperiDONE (RISPERDAL M-TABS) disintegrating tablet 2 mg  2 mg Oral Q8H PRN Clapacs, John T, MD   2 mg at 06/03/22 2105   senna-docusate (Senokot-S) tablet 2 tablet  2 tablet Oral Daily PRN Clapacs, Madie Reno, MD   2 tablet at 06/03/22 1118   ziprasidone (GEODON) injection 20 mg  20 mg Intramuscular Q12H PRN Clapacs, Madie Reno, MD   20 mg at 06/01/22 1951   PTA Medications: Medications Prior to Admission  Medication Sig Dispense Refill Last Dose   acyclovir (ZOVIRAX) 400 MG tablet TAKE 1 TABLET BY MOUTH TWICE A DAY 180 tablet 0    atorvastatin (LIPITOR) 40 MG tablet Take 40 mg by mouth daily.      DULoxetine (CYMBALTA) 30 MG capsule Take 30 mg by mouth at bedtime. (Patient not taking: Reported on 05/29/2022)      escitalopram (LEXAPRO) 20 MG tablet Take 20 mg by mouth daily.  ibuprofen (ADVIL) 800 MG tablet Take 800 mg by mouth every 12 (twelve) hours as needed for mild pain.      meloxicam (MOBIC) 15 MG tablet Take 1 tablet (15 mg total) by mouth daily as needed for pain. (Patient not taking: Reported on 05/29/2022) 30 tablet 0    metaxalone (SKELAXIN) 800 MG tablet Take 1 tablet (800 mg total) by mouth 3 (three) times daily as needed for muscle spasms. 30 tablet 0     Patient Stressors: Medication change or noncompliance   Substance abuse    Patient Strengths: Ability for insight  Motivation for treatment/growth   Treatment Modalities: Medication Management, Group therapy, Case management,  1 to 1 session with clinician, Psychoeducation, Recreational therapy.   Physician Treatment Plan for Primary Diagnosis: Bipolar I disorder, most recent episode (or current) manic (Matthew Gonzales) Long Term Goal(s): Improvement in symptoms so as ready for discharge    Short Term Goals: Ability to maintain clinical measurements within normal limits will improve Compliance with prescribed medications will improve Ability to verbalize feelings will improve Ability to demonstrate self-control will improve Ability to identify and develop effective coping behaviors will improve  Medication Management: Evaluate patient's response, side effects, and tolerance of medication regimen.  Therapeutic Interventions: 1 to 1 sessions, Unit Group sessions and Medication administration.  Evaluation of Outcomes: Progressing  Physician Treatment Plan for Secondary Diagnosis: Principal Problem:   Bipolar I disorder, most recent episode (or current) manic (Matthew Gonzales) Active Problems:   Psychosis (Matthew Gonzales)   Cannabis abuse   Hypertension  Long Term Goal(s): Improvement in symptoms so as ready for discharge   Short Term Goals: Ability to maintain clinical measurements within normal limits will improve Compliance with prescribed medications will improve Ability to verbalize feelings will improve Ability to demonstrate self-control will improve Ability to identify and develop effective coping behaviors will improve     Medication Management: Evaluate patient's response, side effects, and tolerance of medication regimen.  Therapeutic Interventions: 1 to 1 sessions, Unit Group sessions and Medication administration.  Evaluation of Outcomes: Progressing   RN Treatment Plan for Primary Diagnosis: Bipolar I disorder, most recent episode (or current) manic (Matthew Gonzales) Long Term Goal(s): Knowledge of disease and therapeutic regimen to maintain health will improve  Short Term Goals: Ability to demonstrate self-control, Ability to participate in decision making will improve, Ability to verbalize feelings will improve, Ability to disclose and discuss suicidal ideas, Ability to identify and develop effective coping behaviors will improve, and Compliance with prescribed medications will  improve  Medication Management: RN will administer medications as ordered by provider, will assess and evaluate patient's response and provide education to patient for prescribed medication. RN will report any adverse and/or side effects to prescribing provider.  Therapeutic Interventions: 1 on 1 counseling sessions, Psychoeducation, Medication administration, Evaluate responses to treatment, Monitor vital signs and CBGs as ordered, Perform/monitor CIWA, COWS, AIMS and Fall Risk screenings as ordered, Perform wound care treatments as ordered.  Evaluation of Outcomes: Progressing   LCSW Treatment Plan for Primary Diagnosis: Bipolar I disorder, most recent episode (or current) manic ([place]) Long Term Goal(s): Safe transition to appropriate next level of care at discharge, Engage patient in therapeutic group addressing interpersonal concerns.  Short Term Goals: Engage patient in aftercare planning with referrals and resources, Increase social support, Increase ability to appropriately verbalize feelings, Increase emotional regulation, Facilitate acceptance of mental health diagnosis and concerns, and Increase skills for wellness and recovery  Therapeutic Interventions: Assess for all discharge needs, 1 to 1  time with Child psychotherapist, Explore available resources and support systems, Assess for adequacy in community support network, Educate family and significant other(s) on suicide prevention, Complete Psychosocial Assessment, Interpersonal group therapy.  Evaluation of Outcomes: Progressing   Progress in Treatment: Attending groups: Yes. Participating in groups: Yes. Taking medication as prescribed: Yes. Toleration medication: Yes. Family/Significant other contact made: Yes, individual(s) contacted:  SPE completed with the patient and patient's mother. Patient understands diagnosis: Yes. Discussing patient identified problems/goals with staff: Yes. Medical problems stabilized or resolved:  Yes. Denies suicidal/homicidal ideation: Yes. Issues/concerns per patient self-inventory: No. Other: none  New problem(s) identified: No, Describe:  none identified  Update 06/05/2022:  No changes at this time.    New Short Term/Long Term Goal(s): elimination of symptoms of psychosis, medication management for mood stabilization; elimination of SI thoughts; development of comprehensive mental wellness plan.  Update 06/05/2022:  No changes at this time.     Patient Goals:  Pt declined to participate in the treatment team meeting despite personal invitation from the nurse.  Update 06/05/2022:  No changes at this time.    Discharge Plan or Barriers: CSW will assist pt with development of an appropriate aftercare/discharge plan. Update 06/05/2022:  Patient is beginning to clear and appears able to participate in discussions on aftercare plans.  CSW to follow up.   Reason for Continuation of Hospitalization: Delusions  Hallucinations Medication stabilization Suicidal ideation Other; describe Psychosis   Estimated Length of Stay: 1-7 days Update 06/05/2022:  No changes at this time.   Last 3 [place] Suicide Severity Risk Score: Flowsheet Row Admission (Current) from 05/29/2022 in [facility]PAT[facility]INE Most recent reading at 05/29/2022  9:58 PM ED from 05/29/2022 in [facility] Emergency Department at [facility] Most recent reading at 05/29/2022  1:59 PM  C-SSRS RISK CATEGORY No Risk No Risk       Last PHQ 2/9 Scores:     No data to display          Scribe for Treatment Team: Harden Mo, LCSW 06/05/2022 12:00 PM

## 2022-06-05 NOTE — Progress Notes (Signed)
[facility] MD Progress Note  06/05/2022 2:10 PM Matthew Gonzales  MRN:  [id number] Subjective: Follow-up for this 32 year old man with symptoms consistent with bipolar mania.  Continues to be hyperactive and often intrusive.  Talks to himself quite a bit.  Not violent not threatening.  Slept better last night.  Still limited insight. Principal Problem: Bipolar I disorder, most recent episode (or current) manic ([place]) Diagnosis: Principal Problem:   Bipolar I disorder, most recent episode (or current) manic ([place]) Active Problems:   Psychosis ([place])   Cannabis abuse   Hypertension  Total Time spent with patient: 30 minutes  Past Psychiatric History: Past history minimal no previous diagnosis of bipolar disorder  Past Medical History:  Past Medical History:  Diagnosis Date   Epidermal inclusion cyst    HSV-2 (herpes simplex virus 2) infection     Past Surgical History:  Procedure Laterality Date   CYST EXCISION  05/09/15   Epidermal Inclusion Cyst- Back (Dr. Azalee Course)   vertical sleeve gastrectomy     Family History:  Family History  Problem Relation Age of Onset   Hypothyroidism Mother    Hyperlipidemia Father    Pancreatic cancer Maternal Grandmother    Stroke Paternal Grandmother    Cataracts Paternal Grandfather    Family Psychiatric  History: See previous Social History:  Social History   Substance and Sexual Activity  Alcohol Use Yes   Alcohol/week: 0.0 standard drinks of alcohol   Comment: rarely     Social History   Substance and Sexual Activity  Drug Use Yes   Types: Marijuana   Comment: daily cannabis use    Social History   Socioeconomic History   Marital status: Single    Spouse name: Not on file   Number of children: Not on file   Years of education: Not on file   Highest education level: Not on file  Occupational History   Not on file  Tobacco Use   Smoking status: Former    Packs/day: 0.50    Types: Cigarettes   Smokeless tobacco: Never  Vaping Use    Vaping Use: Every day  Substance and Sexual Activity   Alcohol use: Yes    Alcohol/week: 0.0 standard drinks of alcohol    Comment: rarely   Drug use: Yes    Types: Marijuana    Comment: daily cannabis use   Sexual activity: Yes  Other Topics Concern   Not on file  Social History Narrative   Not on file   Social Determinants of Health   Financial Resource Strain: Not on file  Food Insecurity: No Food Insecurity (05/29/2022)   Hunger Vital Sign    Worried About Running Out of Food in the Last Year: Never true    Ran Out of Food in the Last Year: Never true  Transportation Needs: No Transportation Needs (05/29/2022)   PRAPARE - Hydrologist (Medical): No    Lack of Transportation (Non-Medical): No  Physical Activity: Not on file  Stress: Not on file  Social Connections: Not on file   Additional Social History:                         Sleep: Fair  Appetite:  Fair  Current Medications: Current Facility-Administered Medications  Medication Dose Route Frequency Provider Last Rate Last Admin   acetaminophen (TYLENOL) tablet 650 mg  650 mg Oral Q6H PRN Marycatherine Maniscalco, Madie Reno, MD   650 mg at 06/03/22  1619   acyclovir (ZOVIRAX) 200 MG capsule 400 mg  400 mg Oral BID Caroline Sauger, NP   400 mg at 06/05/22 0823   alum & mag hydroxide-simeth (MAALOX/MYLANTA) 200-200-20 MG/5ML suspension 30 mL  30 mL Oral Q4H PRN Caroline Sauger, NP       atorvastatin (LIPITOR) tablet 40 mg  40 mg Oral Daily Caroline Sauger, NP   40 mg at 06/05/22 0824   docusate sodium (COLACE) capsule 100 mg  100 mg Oral BID Thelmer Legler, Madie Reno, MD   100 mg at 06/05/22 0824   hydrOXYzine (ATARAX) tablet 50 mg  50 mg Oral Q6H PRN Karmello Abercrombie, Madie Reno, MD   50 mg at 06/04/22 1846   ibuprofen (ADVIL) tablet 800 mg  800 mg Oral Q12H PRN Caroline Sauger, NP   800 mg at 06/04/22 2152   lisinopril (ZESTRIL) tablet 10 mg  10 mg Oral Daily Myeasha Ballowe, Madie Reno, MD   10 mg at 06/05/22  0824   lithium carbonate (ESKALITH) ER tablet 450 mg  450 mg Oral Q12H Catelin Manthe T, MD   450 mg at 06/05/22 0825   magnesium hydroxide (MILK OF MAGNESIA) suspension 30 mL  30 mL Oral Daily PRN Caroline Sauger, NP   30 mL at 06/03/22 1029   metaxalone (SKELAXIN) tablet 800 mg  800 mg Oral TID PRN Caroline Sauger, NP       nicotine (NICODERM CQ - dosed in mg/24 hours) patch 14 mg  14 mg Transdermal Daily Tylena Prisk, Madie Reno, MD   14 mg at 06/05/22 1245   nicotine polacrilex (NICORETTE) gum 2 mg  2 mg Oral PRN Alcides Nutting, Madie Reno, MD   2 mg at 06/05/22 1212   OLANZapine zydis (ZYPREXA) disintegrating tablet 10 mg  10 mg Oral Q8H PRN Rochelle Nephew, Madie Reno, MD   10 mg at 06/03/22 2105   QUEtiapine (SEROQUEL) tablet 400 mg  400 mg Oral QHS Latoyia Tecson T, MD   400 mg at 06/04/22 2153   senna-docusate (Senokot-S) tablet 2 tablet  2 tablet Oral Daily PRN Alanna Storti, Madie Reno, MD   2 tablet at 06/03/22 1118   ziprasidone (GEODON) injection 20 mg  20 mg Intramuscular Q12H PRN Giuliana Handyside, Madie Reno, MD   20 mg at 06/01/22 1951    Lab Results: No results found for this or any previous visit (from the past 58 hour(s)).  Blood Alcohol level:  Lab Results  Component Value Date   ETH <10 80/99/8338    Metabolic Disorder Labs: Lab Results  Component Value Date   HGBA1C 5.1 05/31/2022   MPG 99.67 05/31/2022   No results found for: "PROLACTIN" Lab Results  Component Value Date   CHOL 138 05/31/2022   TRIG 112 05/31/2022   HDL 28 (L) 05/31/2022   CHOLHDL 4.9 05/31/2022   VLDL 22 05/31/2022   LDLCALC 88 05/31/2022    Physical Findings: AIMS:  , ,  ,  ,    CIWA:    COWS:     Musculoskeletal: Strength & Muscle Tone: within normal limits Gait & Station: normal Patient leans: N/A  Psychiatric Specialty Exam:  Presentation  General Appearance:  Bizarre  Eye Contact: Good  Speech: Clear and Coherent  Speech Volume: Normal  Handedness: Right   Mood and Affect   Mood: Dysphoric  Affect: Inappropriate   Thought Process  Thought Processes: Disorganized; Irrevelant  Descriptions of Associations:Loose  Orientation:Partial  Thought Content:Illogical  History of Schizophrenia/Schizoaffective disorder:No  Duration of Psychotic Symptoms:Less than six months  Hallucinations:No data recorded Ideas of Reference:Paranoia  Suicidal Thoughts:No data recorded Homicidal Thoughts:No data recorded  Sensorium  Memory: Immediate Poor  Judgment: Impaired  Insight: Poor   Executive Functions  Concentration: Poor  Attention Span: Poor  Recall: Poor  Fund of Knowledge: Poor  Language: Poor   Psychomotor Activity  Psychomotor Activity:No data recorded  Assets  Assets: Desire for Improvement; Housing; Physical Health; Resilience; Social Support   Sleep  Sleep:No data recorded   Physical Exam: Physical Exam Vitals and nursing note reviewed.  Constitutional:      Appearance: Normal appearance.  HENT:     Head: Normocephalic and atraumatic.     Mouth/Throat:     Pharynx: Oropharynx is clear.  Eyes:     Pupils: Pupils are equal, round, and reactive to light.  Cardiovascular:     Rate and Rhythm: Normal rate and regular rhythm.  Pulmonary:     Effort: Pulmonary effort is normal.     Breath sounds: Normal breath sounds.  Abdominal:     General: Abdomen is flat.     Palpations: Abdomen is soft.  Musculoskeletal:        General: Normal range of motion.  Skin:    General: Skin is warm and dry.  Neurological:     General: No focal deficit present.     Mental Status: He is alert. Mental status is at baseline.  Psychiatric:        Attention and Perception: Attention normal.        Mood and Affect: Mood is anxious.        Speech: Speech is tangential.        Behavior: Behavior is agitated. Behavior is not aggressive.        Thought Content: Thought content is paranoid.    Review of Systems  Constitutional:  Negative.   HENT: Negative.    Eyes: Negative.   Respiratory: Negative.    Cardiovascular: Negative.   Gastrointestinal: Negative.   Musculoskeletal: Negative.   Skin: Negative.   Neurological: Negative.   Psychiatric/Behavioral: Negative.     Blood pressure 124/88, pulse 92, temperature (!) 97.5 F (36.4 C), temperature source Oral, resp. rate 18, height 5\' 10"  (1.778 m), weight 95.3 kg, SpO2 100 %. Body mass index is 30.15 kg/m.   Treatment Plan Summary: Medication management and Plan patient does perhaps seem slightly better.  He is medicine compliant.  Now on lithium and Seroquel.  Psychoeducation and encouragement done with him.  Patient does pace a lot and had complained at one point that he felt he could not sit still.  Reviewing the medicine I am going to stop the Risperdal as needed.  It may be causing akathisia and is unnecessary given other medicines.  , MD 06/05/2022, 2:10 PM

## 2022-06-05 NOTE — Group Note (Signed)
[facility] LCSW Group Therapy Note   Group Date: 06/05/2022 Start Time: 1300 End Time: 1400   Type of Therapy/Topic:  Group Therapy:  Emotion Regulation  Participation Level:  Active   Mood: euthymic   Description of Group:    The purpose of this group is to assist patients in learning to regulate negative emotions and experience positive emotions. Patients will be guided to discuss ways in which they have been vulnerable to their negative emotions. These vulnerabilities will be juxtaposed with experiences of positive emotions or situations, and patients challenged to use positive emotions to combat negative ones. Special emphasis will be placed on coping with negative emotions in conflict situations, and patients will process healthy conflict resolution skills.  Therapeutic Goals: Patient will identify two positive emotions or experiences to reflect on in order to balance out negative emotions:  Patient will label two or more emotions that they find the most difficult to experience:  Patient will be able to demonstrate positive conflict resolution skills through discussion or role plays:   Summary of Patient Progress: Patient present for entirety of group. Attempted to participate in topic of discussion, though eventually became tangential. CSW made attempts to redirect patient and tie conversation back into topic of discussion.     Therapeutic Modalities:   Cognitive Behavioral Therapy Feelings Identification Dialectical Behavioral Therapy   [name], [place]

## 2022-06-05 NOTE — Progress Notes (Signed)
D- Patient alert and oriented x 4. Affect anxious/mood anxious Denies SI/ HI/ AVH.   He endorses anxiety. Denies pain. States he "wants another room". A- Scheduled medications administered to patient, per MD orders. Support and encouragement provided.  Routine safety checks conducted every 15 minutes.  Patient informed to notify staff with problems or concerns. R- No adverse drug reactions noted. Patient compliant with medications. Patient anxious and outspoken but cooperative. Patient spent time out of the room most of the shift.  Patient contracts for safety and  remains safe on the unit at this time.

## 2022-06-05 NOTE — Plan of Care (Signed)
  Problem: Coping: Goal: Level of anxiety will decrease Outcome: Progressing   Problem: Safety: Goal: Ability to remain free from injury will improve Outcome: Progressing   Problem: Education: Goal: Emotional status will improve Outcome: Progressing  Patient compliant with treatment pacing in and out of his room interacting well with Peers and Staff. Denies SI/HI/A/VH and verbally contracted for safety. Support and encouragement provided.

## 2022-06-05 NOTE — Progress Notes (Signed)
Recreation Therapy Notes  Date: 06/05/2022   Time: 10:30 am   Location: Courtyard      Behavioral response: N/A   Intervention Topic: Values    Discussion/Intervention: Patient refused to attend group.    Clinical Observations/Feedback:  Patient refused to attend group.    Shreshta Medley LRT/CTRS          Maudine Kluesner 06/05/2022 11:01 AM

## 2022-06-05 NOTE — Plan of Care (Signed)
  Problem: Education: Goal: Knowledge of General Education information will improve Description: Including pain rating scale, medication(s)/side effects and non-pharmacologic comfort measures Outcome: Progressing   Problem: Health Behavior/Discharge Planning: Goal: Ability to manage health-related needs will improve Outcome: Progressing   Problem: Clinical Measurements: Goal: Ability to maintain clinical measurements within normal limits will improve Outcome: Progressing Goal: Will remain free from infection Outcome: Progressing Goal: Diagnostic test results will improve Outcome: Progressing Goal: Respiratory complications will improve Outcome: Progressing Goal: Cardiovascular complication will be avoided Outcome: Progressing   Problem: Activity: Goal: Risk for activity intolerance will decrease Outcome: Progressing   Problem: Nutrition: Goal: Adequate nutrition will be maintained Outcome: Progressing   Problem: Coping: Goal: Level of anxiety will decrease Outcome: Progressing   Problem: Elimination: Goal: Will not experience complications related to bowel motility Outcome: Progressing Goal: Will not experience complications related to urinary retention Outcome: Progressing   Problem: Pain Managment: Goal: General experience of comfort will improve Outcome: Progressing   Problem: Safety: Goal: Ability to remain free from injury will improve Outcome: Progressing   Problem: Skin Integrity: Goal: Risk for impaired skin integrity will decrease Outcome: Progressing   Problem: Education: Goal: Knowledge of [place] [facility] Education information/materials will improve Outcome: Progressing Goal: Emotional status will improve Outcome: Progressing Goal: Mental status will improve Outcome: Progressing Goal: Verbalization of understanding the information provided will improve Outcome: Progressing   Problem: Activity: Goal: Interest or engagement in activities will  improve Outcome: Progressing Goal: Sleeping patterns will improve Outcome: Progressing   Problem: Coping: Goal: Ability to verbalize frustrations and anger appropriately will improve Outcome: Progressing Goal: Ability to demonstrate self-control will improve Outcome: Progressing   Problem: Health Behavior/Discharge Planning: Goal: Identification of resources available to assist in meeting health care needs will improve Outcome: Progressing Goal: Compliance with treatment plan for underlying cause of condition will improve Outcome: Progressing   Problem: Physical Regulation: Goal: Ability to maintain clinical measurements within normal limits will improve Outcome: Progressing   Problem: Safety: Goal: Periods of time without injury will increase Outcome: Progressing   

## 2022-06-06 DIAGNOSIS — F3110 Bipolar disorder, current episode manic without psychotic features, unspecified: Secondary | ICD-10-CM

## 2022-06-06 NOTE — Progress Notes (Signed)
D- Patient alert and oriented x 4. Affect anxious/mood euthymic. Denies SI/ HI/ AVH. He denies pain. He requests PRN Nicotine gum for cravings. A- Scheduled medications administered to patient, per MD orders. Support and encouragement provided.  Routine safety checks conducted every 15 minutes.  Patient informed to notify staff with problems or concerns. R- No adverse drug reactions noted. Patient compliant with medications and treatment plan. Patient anxious but pleasant. Patient interacts well with others on the unit.  Patient contracts for safety and remains safe on the unit at this time.

## 2022-06-06 NOTE — Progress Notes (Signed)
Patient anxious throughout shift. Noted walking halls continuously. Minimal interaction with peers other than telling staff what they are requesting. Refused to take lithium reports it makes him high. Angry with provider states he does not listen to him. Patient reports feel like heart rate was racing. Vital signs checked. Patient HR slightly eleveated as well as B:P. Patient continued to talk and bounce arms and legs while testing. Writer contacted on call provider. Veteran asleep when responded. Med ordered, patient would not wake up for med. Denies SI, HI, AVH. Wants to be discharged .

## 2022-06-06 NOTE — [facility] Group Notes (Signed)
[place] Group Notes:  (Nursing/MHT/Case Management/Adjunct)  Date:  06/06/2022  Time:  8:40 PM  Type of Therapy:   Wrap up  Participation Level:  Did Not Attend  Summary of Progress/Problems:  Matthew Gonzales 06/06/2022, 8:40 PM

## 2022-06-06 NOTE — Progress Notes (Signed)
[facility] MD Progress Note  06/06/2022 4:10 PM Matthew Gonzales  MRN:  [id number] Subjective: Follow-up for this patient who has a bipolar manic presentation.  Patient overall seems to be less intrusive.  He is not pounding on my door nearly as frequently.  He has not been disruptive with the staff.  He is able to carry on lucid conversations with staff and patients.  Sleeping better.  On interview I noticed that he is a little more attentive and is not talking to himself constantly.  Still has no insight.  Tells me that when he gets out of here he is going to move to [place] or [place] because it is warm there.  Still rambling in some of his speech and a bit grandiose.  Patient told me he did not want to take the lithium anymore because it made him feel "high" and he felt like he was "craving" it.  I could not really make much sense out of that but he is insistent that he does not want to continue it although he says he will take the Seroquel. Principal Problem: Bipolar I disorder, most recent episode (or current) manic ([place]) Diagnosis: Principal Problem:   Bipolar I disorder, most recent episode (or current) manic ([place]) Active Problems:   Psychosis ([place])   Cannabis abuse   Hypertension  Total Time spent with patient: 30 minutes  Past Psychiatric History: As noted multiple times really no clearcut past history  Past Medical History:  Past Medical History:  Diagnosis Date   Epidermal inclusion cyst    HSV-2 (herpes simplex virus 2) infection     Past Surgical History:  Procedure Laterality Date   CYST EXCISION  05/09/15   Epidermal Inclusion Cyst- Back (Dr. Azalee Course)   vertical sleeve gastrectomy     Family History:  Family History  Problem Relation Age of Onset   Hypothyroidism Mother    Hyperlipidemia Father    Pancreatic cancer Maternal Grandmother    Stroke Paternal Grandmother    Cataracts Paternal Grandfather    Family Psychiatric  History: See previous Social History:  Social  History   Substance and Sexual Activity  Alcohol Use Yes   Alcohol/week: 0.0 standard drinks of alcohol   Comment: rarely     Social History   Substance and Sexual Activity  Drug Use Yes   Types: Marijuana   Comment: daily cannabis use    Social History   Socioeconomic History   Marital status: Single    Spouse name: Not on file   Number of children: Not on file   Years of education: Not on file   Highest education level: Not on file  Occupational History   Not on file  Tobacco Use   Smoking status: Former    Packs/day: 0.50    Types: Cigarettes   Smokeless tobacco: Never  Vaping Use   Vaping Use: Every day  Substance and Sexual Activity   Alcohol use: Yes    Alcohol/week: 0.0 standard drinks of alcohol    Comment: rarely   Drug use: Yes    Types: Marijuana    Comment: daily cannabis use   Sexual activity: Yes  Other Topics Concern   Not on file  Social History Narrative   Not on file   Social Determinants of Health   Financial Resource Strain: Not on file  Food Insecurity: No Food Insecurity (05/29/2022)   Hunger Vital Sign    Worried About Running Out of Food in the Last Year: Never true  Ran Out of Food in the Last Year: Never true  Transportation Needs: No Transportation Needs (05/29/2022)   PRAPARE - Administrator, Civil Service (Medical): No    Lack of Transportation (Non-Medical): No  Physical Activity: Not on file  Stress: Not on file  Social Connections: Not on file   Additional Social History:                         Sleep: Fair  Appetite:  Fair  Current Medications: Current Facility-Administered Medications  Medication Dose Route Frequency Provider Last Rate Last Admin   acetaminophen (TYLENOL) tablet 650 mg  650 mg Oral Q6H PRN Tessica Cupo T, MD   650 mg at 06/03/22 1619   acyclovir (ZOVIRAX) 200 MG capsule 400 mg  400 mg Oral BID Gillermo Murdoch, NP   400 mg at 06/06/22 0757   alum & mag hydroxide-simeth  (MAALOX/MYLANTA) 200-200-20 MG/5ML suspension 30 mL  30 mL Oral Q4H PRN Gillermo Murdoch, NP       atorvastatin (LIPITOR) tablet 40 mg  40 mg Oral Daily Gillermo Murdoch, NP   40 mg at 06/06/22 0758   cloNIDine (CATAPRES) tablet 0.2 mg  0.2 mg Oral Once Gillermo Murdoch, NP       docusate sodium (COLACE) capsule 100 mg  100 mg Oral BID Bernard Donahoo T, MD   100 mg at 06/06/22 0758   hydrOXYzine (ATARAX) tablet 50 mg  50 mg Oral Q6H PRN Brek Reece T, MD   50 mg at 06/04/22 1846   ibuprofen (ADVIL) tablet 800 mg  800 mg Oral Q12H PRN Gillermo Murdoch, NP   800 mg at 06/06/22 0540   lisinopril (ZESTRIL) tablet 10 mg  10 mg Oral Daily Gautham Hewins, Jackquline Denmark, MD   10 mg at 06/06/22 0757   lithium carbonate (ESKALITH) ER tablet 450 mg  450 mg Oral Q12H Lillyana Majette T, MD   450 mg at 06/06/22 0758   magnesium hydroxide (MILK OF MAGNESIA) suspension 30 mL  30 mL Oral Daily PRN Gillermo Murdoch, NP   30 mL at 06/03/22 1029   metaxalone (SKELAXIN) tablet 800 mg  800 mg Oral TID PRN Gillermo Murdoch, NP       nicotine (NICODERM CQ - dosed in mg/24 hours) patch 14 mg  14 mg Transdermal Daily Eldana Isip, Jackquline Denmark, MD   14 mg at 06/06/22 9323   nicotine polacrilex (NICORETTE) gum 2 mg  2 mg Oral PRN Winter Trefz T, MD   2 mg at 06/06/22 1409   OLANZapine zydis (ZYPREXA) disintegrating tablet 10 mg  10 mg Oral Q8H PRN Brazil Voytko, Jackquline Denmark, MD   10 mg at 06/03/22 2105   QUEtiapine (SEROQUEL) tablet 400 mg  400 mg Oral QHS Shandi Godfrey T, MD   400 mg at 06/05/22 2103   senna-docusate (Senokot-S) tablet 2 tablet  2 tablet Oral Daily PRN Ettamae Barkett, Jackquline Denmark, MD   2 tablet at 06/03/22 1118   ziprasidone (GEODON) injection 20 mg  20 mg Intramuscular Q12H PRN Beverly Ferner, Jackquline Denmark, MD   20 mg at 06/01/22 1951    Lab Results: No results found for this or any previous visit (from the past 48 hour(s)).  Blood Alcohol level:  Lab Results  Component Value Date   ETH <10 05/29/2022    Metabolic Disorder Labs: Lab  Results  Component Value Date   HGBA1C 5.1 05/31/2022   MPG 99.67 05/31/2022   No results found for: "  PROLACTIN" Lab Results  Component Value Date   CHOL 138 05/31/2022   TRIG 112 05/31/2022   HDL 28 (L) 05/31/2022   CHOLHDL 4.9 05/31/2022   VLDL 22 05/31/2022   LDLCALC 88 05/31/2022    Physical Findings: AIMS:  , ,  ,  ,    CIWA:    COWS:     Musculoskeletal: Strength & Muscle Tone: within normal limits Gait & Station: normal Patient leans: N/A  Psychiatric Specialty Exam:  Presentation  General Appearance:  Bizarre  Eye Contact: Good  Speech: Clear and Coherent  Speech Volume: Normal  Handedness: Right   Mood and Affect  Mood: Dysphoric  Affect: Inappropriate   Thought Process  Thought Processes: Disorganized; Irrevelant  Descriptions of Associations:Loose  Orientation:Partial  Thought Content:Illogical  History of Schizophrenia/Schizoaffective disorder:No  Duration of Psychotic Symptoms:Less than six months  Hallucinations:No data recorded Ideas of Reference:Paranoia  Suicidal Thoughts:No data recorded Homicidal Thoughts:No data recorded  Sensorium  Memory: Immediate Poor  Judgment: Impaired  Insight: Poor   Executive Functions  Concentration: Poor  Attention Span: Poor  Recall: Poor  Fund of Knowledge: Poor  Language: Poor   Psychomotor Activity  Psychomotor Activity:No data recorded  Assets  Assets: Desire for Improvement; Housing; Physical Health; Resilience; Social Support   Sleep  Sleep:No data recorded   Physical Exam: Physical Exam Vitals and nursing note reviewed.  Constitutional:      Appearance: Normal appearance.  HENT:     Head: Normocephalic and atraumatic.     Mouth/Throat:     Pharynx: Oropharynx is clear.  Eyes:     Pupils: Pupils are equal, round, and reactive to light.  Cardiovascular:     Rate and Rhythm: Normal rate and regular rhythm.  Pulmonary:     Effort: Pulmonary  effort is normal.     Breath sounds: Normal breath sounds.  Abdominal:     General: Abdomen is flat.     Palpations: Abdomen is soft.  Musculoskeletal:        General: Normal range of motion.  Skin:    General: Skin is warm and dry.  Neurological:     General: No focal deficit present.     Mental Status: He is alert. Mental status is at baseline.  Psychiatric:        Attention and Perception: He is inattentive.        Mood and Affect: Mood is anxious.        Speech: Speech is tangential.        Behavior: Behavior is cooperative.        Thought Content: Thought content normal.        Cognition and Memory: Cognition normal.        Judgment: Judgment is impulsive.    Review of Systems  Constitutional: Negative.   HENT: Negative.    Eyes: Negative.   Respiratory: Negative.    Cardiovascular: Negative.   Gastrointestinal: Negative.   Musculoskeletal: Negative.   Skin: Negative.   Neurological: Negative.   Psychiatric/Behavioral: Negative.     Blood pressure 132/79, pulse 89, temperature 99.1 F (37.3 C), temperature source Oral, resp. rate 18, height 5\' 10"  (1.778 m), weight 95.3 kg, SpO2 100 %. Body mass index is 30.15 kg/m.   Treatment Plan Summary: Medication management and Plan patient was adamant about stopping the lithium so that will be discontinued.  Continue the Seroquel.  Encouraged him to make sure he is sleeping well.  I wanted to get more information from  his family and called his mother back tonight but had to leave a voicemail message.  Alethia Berthold, MD 06/06/2022, 4:10 PM

## 2022-06-06 NOTE — Group Note (Signed)
LCSW Group Therapy Note   Group Date: 06/06/2022 Start Time: 1300 End Time: 1400   Type of Therapy and Topic:  Group Therapy: Boundaries  Participation Level:  Minimal  Description of Group: This group will address the use of boundaries in their personal lives. Patients will explore why boundaries are important, the difference between healthy and unhealthy boundaries, and negative and postive outcomes of different boundaries and will look at how boundaries can be crossed.  Patients will be encouraged to identify current boundaries in their own lives and identify what kind of boundary is being set. Facilitators will guide patients in utilizing problem-solving interventions to address and correct types boundaries being used and to address when no boundary is being used. Understanding and applying boundaries will be explored and addressed for obtaining and maintaining a balanced life. Patients will be encouraged to explore ways to assertively make their boundaries and needs known to significant others in their lives, using other group members and facilitator for role play, support, and feedback.  Therapeutic Goals:  1.  Patient will identify areas in their life where setting clear boundaries could be  used to improve their life.  2.  Patient will identify signs/triggers that a boundary is not being respected. 3.  Patient will identify two ways to set boundaries in order to achieve balance in  their lives: 4.  Patient will demonstrate ability to communicate their needs and set boundaries  through discussion and/or role plays  Summary of Patient Progress:  Patient was present in group.  Patient engaged in separate conversations throughout group and had to be redirected.  While in group and not intrusive patient did present as a barrier to the rest of group.    Therapeutic Modalities:   Cognitive Behavioral Therapy Solution-Focused Therapy  Maryjane Hurter 06/06/2022  2:52 PM

## 2022-06-06 NOTE — Plan of Care (Signed)
  Problem: Education: Goal: Knowledge of General Education information will improve Description: Including pain rating scale, medication(s)/side effects and non-pharmacologic comfort measures Outcome: Progressing   Problem: Health Behavior/Discharge Planning: Goal: Ability to manage health-related needs will improve Outcome: Progressing   Problem: Clinical Measurements: Goal: Ability to maintain clinical measurements within normal limits will improve Outcome: Progressing Goal: Will remain free from infection Outcome: Progressing Goal: Diagnostic test results will improve Outcome: Progressing Goal: Respiratory complications will improve Outcome: Progressing Goal: Cardiovascular complication will be avoided Outcome: Progressing   Problem: Activity: Goal: Risk for activity intolerance will decrease Outcome: Progressing   Problem: Nutrition: Goal: Adequate nutrition will be maintained Outcome: Progressing   Problem: Coping: Goal: Level of anxiety will decrease Outcome: Progressing   Problem: Elimination: Goal: Will not experience complications related to bowel motility Outcome: Progressing Goal: Will not experience complications related to urinary retention Outcome: Progressing   Problem: Pain Managment: Goal: General experience of comfort will improve Outcome: Progressing   Problem: Safety: Goal: Ability to remain free from injury will improve Outcome: Progressing   Problem: Skin Integrity: Goal: Risk for impaired skin integrity will decrease Outcome: Progressing   Problem: Education: Goal: Knowledge of [facility] Education information/materials will improve Outcome: Progressing Goal: Emotional status will improve Outcome: Progressing Goal: Mental status will improve Outcome: Progressing Goal: Verbalization of understanding the information provided will improve Outcome: Progressing   Problem: Activity: Goal: Interest or engagement in activities will  improve Outcome: Progressing Goal: Sleeping patterns will improve Outcome: Progressing   Problem: Coping: Goal: Ability to verbalize frustrations and anger appropriately will improve Outcome: Progressing Goal: Ability to demonstrate self-control will improve Outcome: Progressing   Problem: Health Behavior/Discharge Planning: Goal: Identification of resources available to assist in meeting health care needs will improve Outcome: Progressing Goal: Compliance with treatment plan for underlying cause of condition will improve Outcome: Progressing   Problem: Physical Regulation: Goal: Ability to maintain clinical measurements within normal limits will improve Outcome: Progressing   Problem: Safety: Goal: Periods of time without injury will increase Outcome: Progressing   

## 2022-06-07 ENCOUNTER — Other Ambulatory Visit: Payer: Self-pay

## 2022-06-07 MED ORDER — ATORVASTATIN CALCIUM 40 MG PO TABS
40.0000 mg | ORAL_TABLET | Freq: Every day | ORAL | 0 refills | Status: DC
  Filled 2022-06-07: qty 10, 10d supply, fill #0

## 2022-06-07 MED ORDER — QUETIAPINE FUMARATE 400 MG PO TABS
400.0000 mg | ORAL_TABLET | Freq: Every day | ORAL | 0 refills | Status: DC
  Filled 2022-06-07: qty 10, 10d supply, fill #0

## 2022-06-07 MED ORDER — DOCUSATE SODIUM 100 MG PO CAPS
100.0000 mg | ORAL_CAPSULE | Freq: Two times a day (BID) | ORAL | 0 refills | Status: DC
  Filled 2022-06-07: qty 20, 10d supply, fill #0

## 2022-06-07 MED ORDER — NICOTINE 14 MG/24HR TD PT24
14.0000 mg | MEDICATED_PATCH | Freq: Every day | TRANSDERMAL | 0 refills | Status: DC
  Filled 2022-06-07: qty 14, 14d supply, fill #0

## 2022-06-07 MED ORDER — PROPRANOLOL HCL 20 MG PO TABS
20.0000 mg | ORAL_TABLET | Freq: Every day | ORAL | 0 refills | Status: DC
  Filled 2022-06-07: qty 10, 10d supply, fill #0

## 2022-06-07 MED ORDER — ACYCLOVIR 200 MG PO CAPS
400.0000 mg | ORAL_CAPSULE | Freq: Two times a day (BID) | ORAL | 0 refills | Status: DC
  Filled 2022-06-07: qty 40, 10d supply, fill #0

## 2022-06-07 MED ORDER — NICOTINE POLACRILEX 2 MG MT GUM
2.0000 mg | CHEWING_GUM | OROMUCOSAL | 0 refills | Status: DC | PRN
  Filled 2022-06-07: qty 50, 10d supply, fill #0

## 2022-06-07 MED ORDER — METAXALONE 800 MG PO TABS
800.0000 mg | ORAL_TABLET | Freq: Three times a day (TID) | ORAL | 0 refills | Status: DC | PRN
  Filled 2022-06-07 (×2): qty 30, 10d supply, fill #0

## 2022-06-07 MED ORDER — PROPRANOLOL HCL 20 MG PO TABS
20.0000 mg | ORAL_TABLET | Freq: Every day | ORAL | Status: DC
  Administered 2022-06-07 – 2022-06-09 (×3): 20 mg via ORAL
  Filled 2022-06-07 (×3): qty 1

## 2022-06-07 MED ORDER — LISINOPRIL 10 MG PO TABS
10.0000 mg | ORAL_TABLET | Freq: Every day | ORAL | 0 refills | Status: DC
  Filled 2022-06-07: qty 10, 10d supply, fill #0

## 2022-06-07 NOTE — Progress Notes (Signed)
[facility] MD Progress Note  06/07/2022 11:44 AM Matthew Gonzales  MRN:  [id number] Subjective: Patient seen and chart reviewed.  32 year old man who presented with manic symptoms.  Patient says he is feeling good today.  He has no complaints about his mood.  He says his anger is feeling better.  He comes across as more organized and is no longer talking to himself or acting strangely.  Patient complains that he feels like the Seroquel makes him tachycardic after he takes it.  This is a fairly common complaint with that medicine.  Vitals are stable. Principal Problem: Bipolar I disorder, most recent episode (or current) manic ([place]) Diagnosis: Principal Problem:   Bipolar I disorder, most recent episode (or current) manic ([place]) Active Problems:   Psychosis ([place])   Cannabis abuse   Hypertension  Total Time spent with patient: 30 minutes  Past Psychiatric History: Past history really minimal  Past Medical History:  Past Medical History:  Diagnosis Date   Epidermal inclusion cyst    HSV-2 (herpes simplex virus 2) infection     Past Surgical History:  Procedure Laterality Date   CYST EXCISION  05/09/15   Epidermal Inclusion Cyst- Back (Dr. Azalee Course)   vertical sleeve gastrectomy     Family History:  Family History  Problem Relation Age of Onset   Hypothyroidism Mother    Hyperlipidemia Father    Pancreatic cancer Maternal Grandmother    Stroke Paternal Grandmother    Cataracts Paternal Grandfather    Family Psychiatric  History: See previous Social History:  Social History   Substance and Sexual Activity  Alcohol Use Yes   Alcohol/week: 0.0 standard drinks of alcohol   Comment: rarely     Social History   Substance and Sexual Activity  Drug Use Yes   Types: Marijuana   Comment: daily cannabis use    Social History   Socioeconomic History   Marital status: Single    Spouse name: Not on file   Number of children: Not on file   Years of education: Not on file   Highest  education level: Not on file  Occupational History   Not on file  Tobacco Use   Smoking status: Former    Packs/day: 0.50    Types: Cigarettes   Smokeless tobacco: Never  Vaping Use   Vaping Use: Every day  Substance and Sexual Activity   Alcohol use: Yes    Alcohol/week: 0.0 standard drinks of alcohol    Comment: rarely   Drug use: Yes    Types: Marijuana    Comment: daily cannabis use   Sexual activity: Yes  Other Topics Concern   Not on file  Social History Narrative   Not on file   Social Determinants of Health   Financial Resource Strain: Not on file  Food Insecurity: No Food Insecurity (05/29/2022)   Hunger Vital Sign    Worried About Running Out of Food in the Last Year: Never true    Ran Out of Food in the Last Year: Never true  Transportation Needs: No Transportation Needs (05/29/2022)   PRAPARE - Hydrologist (Medical): No    Lack of Transportation (Non-Medical): No  Physical Activity: Not on file  Stress: Not on file  Social Connections: Not on file   Additional Social History:                         Sleep: Fair  Appetite:  Fair  Current Medications: Current Facility-Administered Medications  Medication Dose Route Frequency Provider Last Rate Last Admin   acetaminophen (TYLENOL) tablet 650 mg  650 mg Oral Q6H PRN Deloris Moger T, MD   650 mg at 06/07/22 0806   acyclovir (ZOVIRAX) 200 MG capsule 400 mg  400 mg Oral BID Caroline Sauger, NP   400 mg at 06/07/22 0807   alum & mag hydroxide-simeth (MAALOX/MYLANTA) 200-200-20 MG/5ML suspension 30 mL  30 mL Oral Q4H PRN Caroline Sauger, NP       atorvastatin (LIPITOR) tablet 40 mg  40 mg Oral Daily Caroline Sauger, NP   40 mg at 06/07/22 0805   cloNIDine (CATAPRES) tablet 0.2 mg  0.2 mg Oral Once Caroline Sauger, NP       docusate sodium (COLACE) capsule 100 mg  100 mg Oral BID Semiah Konczal T, MD   100 mg at 06/06/22 1657   hydrOXYzine (ATARAX) tablet  50 mg  50 mg Oral Q6H PRN Rihaan Barrack, Madie Reno, MD   50 mg at 06/07/22 8416   ibuprofen (ADVIL) tablet 800 mg  800 mg Oral Q12H PRN Caroline Sauger, NP   800 mg at 06/07/22 6063   lisinopril (ZESTRIL) tablet 10 mg  10 mg Oral Daily Minette Manders T, MD   10 mg at 06/07/22 0805   magnesium hydroxide (MILK OF MAGNESIA) suspension 30 mL  30 mL Oral Daily PRN Caroline Sauger, NP   30 mL at 06/03/22 1029   metaxalone (SKELAXIN) tablet 800 mg  800 mg Oral TID PRN Caroline Sauger, NP       nicotine (NICODERM CQ - dosed in mg/24 hours) patch 14 mg  14 mg Transdermal Daily Lillard Bailon, Madie Reno, MD   14 mg at 06/07/22 0160   nicotine polacrilex (NICORETTE) gum 2 mg  2 mg Oral PRN Narda Fundora, Madie Reno, MD   2 mg at 06/07/22 1107   OLANZapine zydis (ZYPREXA) disintegrating tablet 10 mg  10 mg Oral Q8H PRN Rodneshia Greenhouse, Madie Reno, MD   10 mg at 06/03/22 2105   propranolol (INDERAL) tablet 20 mg  20 mg Oral QHS Nachum Derossett T, MD       QUEtiapine (SEROQUEL) tablet 400 mg  400 mg Oral QHS Klayton Monie T, MD   400 mg at 06/06/22 2104   senna-docusate (Senokot-S) tablet 2 tablet  2 tablet Oral Daily PRN Art Levan, Madie Reno, MD   2 tablet at 06/03/22 1118   ziprasidone (GEODON) injection 20 mg  20 mg Intramuscular Q12H PRN Timoth Schara, Madie Reno, MD   20 mg at 06/01/22 1951    Lab Results: No results found for this or any previous visit (from the past 59 hour(s)).  Blood Alcohol level:  Lab Results  Component Value Date   ETH <10 10/93/2355    Metabolic Disorder Labs: Lab Results  Component Value Date   HGBA1C 5.1 05/31/2022   MPG 99.67 05/31/2022   No results found for: "PROLACTIN" Lab Results  Component Value Date   CHOL 138 05/31/2022   TRIG 112 05/31/2022   HDL 28 (L) 05/31/2022   CHOLHDL 4.9 05/31/2022   VLDL 22 05/31/2022   LDLCALC 88 05/31/2022    Physical Findings: AIMS:  , ,  ,  ,    CIWA:    COWS:     Musculoskeletal: Strength & Muscle Tone: within normal limits Gait & Station: normal Patient  leans: N/A  Psychiatric Specialty Exam:  Presentation  General Appearance:  Bizarre  Eye Contact: Good  Speech: Clear  and Coherent  Speech Volume: Normal  Handedness: Right   Mood and Affect  Mood: Dysphoric  Affect: Inappropriate   Thought Process  Thought Processes: Disorganized; Irrevelant  Descriptions of Associations:Loose  Orientation:Partial  Thought Content:Illogical  History of Schizophrenia/Schizoaffective disorder:No  Duration of Psychotic Symptoms:Less than six months  Hallucinations:No data recorded Ideas of Reference:Paranoia  Suicidal Thoughts:No data recorded Homicidal Thoughts:No data recorded  Sensorium  Memory: Immediate Poor  Judgment: Impaired  Insight: Poor   Executive Functions  Concentration: Poor  Attention Span: Poor  Recall: Poor  Fund of Knowledge: Poor  Language: Poor   Psychomotor Activity  Psychomotor Activity:No data recorded  Assets  Assets: Desire for Improvement; Housing; Physical Health; Resilience; Social Support   Sleep  Sleep:No data recorded   Physical Exam: Physical Exam Vitals and nursing note reviewed.  Constitutional:      Appearance: Normal appearance.  HENT:     Head: Normocephalic and atraumatic.     Mouth/Throat:     Pharynx: Oropharynx is clear.  Eyes:     Pupils: Pupils are equal, round, and reactive to light.  Cardiovascular:     Rate and Rhythm: Normal rate and regular rhythm.  Pulmonary:     Effort: Pulmonary effort is normal.     Breath sounds: Normal breath sounds.  Abdominal:     General: Abdomen is flat.     Palpations: Abdomen is soft.  Musculoskeletal:        General: Normal range of motion.  Skin:    General: Skin is warm and dry.  Neurological:     General: No focal deficit present.     Mental Status: He is alert. Mental status is at baseline.  Psychiatric:        Attention and Perception: Attention normal.        Mood and Affect: Mood  normal.        Speech: Speech is tangential.        Behavior: Behavior is cooperative.        Thought Content: Thought content normal.        Cognition and Memory: Cognition normal.    Review of Systems  Constitutional: Negative.   HENT: Negative.    Eyes: Negative.   Respiratory: Negative.    Cardiovascular: Negative.   Gastrointestinal: Negative.   Musculoskeletal: Negative.   Skin: Negative.   Neurological: Negative.   Psychiatric/Behavioral: Negative.     Blood pressure 120/82, pulse 86, temperature 97.8 F (36.6 C), temperature source Oral, resp. rate 18, height 5\' 10"  (1.778 m), weight 95.3 kg, SpO2 100 %. Body mass index is 30.15 kg/m.   Treatment Plan Summary: Medication management and Plan much improved but still a little hypomanic.  Insight partial at best.  As far as the tachycardia I am going to try adding 20 mg of fast-acting propranolol to be taken with the Seroquel and see if that helps with it.  Otherwise encouraged him to stay on this dose of medicine.  If things are looking good we may be looking at discharge after the weekend  , MD 06/07/2022, 11:44 AM

## 2022-06-07 NOTE — Group Note (Signed)
[facility] LCSW Group Therapy Note   Group Date: 06/07/2022 Start Time: 1300 End Time: 1400  Type of Therapy and Topic:  Group Therapy:  Feelings around Relapse and Recovery  Participation Level:  Active    Description of Group:    Patients in this group will discuss emotions they experience before and after a relapse. They will process how experiencing these feelings, or avoidance of experiencing them, relates to having a relapse. Facilitator will guide patients to explore emotions they have related to recovery. Patients will be encouraged to process which emotions are more powerful. They will be guided to discuss the emotional reaction significant others in their lives may have to patients' relapse or recovery. Patients will be assisted in exploring ways to respond to the emotions of others without this contributing to a relapse.  Therapeutic Goals: Patient will identify two or more emotions that lead to relapse for them:  Patient will identify two emotions that result when they relapse:  Patient will identify two emotions related to recovery:  Patient will demonstrate ability to communicate their needs through discussion and/or role plays.   Summary of Patient Progress: Patient was present for the entirety of the group process. He defined relapse as a return to something/use. Pt denied any relapses or anything that he was not trying to stop doing/avoid relapsing into. Potential risk factor for relapse noted as arrogant people. He stated that returning home or to family may be a protective factor for some while it may not be for others. He stated that being around support people who listen and can give constructive feedback is something that he can do to focus on recovery after leaving the hospital.    Therapeutic Modalities:   Cognitive Behavioral Therapy Solution-Focused Therapy Assertiveness Training Relapse Prevention Therapy   Shirl Harris, LCSW

## 2022-06-07 NOTE — Progress Notes (Addendum)
Patient is A+O x 4. He denies SI/HI/AVH.  Denies depression but endorses anxiety. Atarax 50 mg adm at 0800. Upon follow, it was found to have provided relief. He complains of back pain and is given Tylenol at 0800. Upon follow up, pain decreased from a 3 to a 1. Appetite good. Patient is medication compliant. He requests nic gum throughout the day to help with cravings.  Towards the end of the shift, patient states that he will no longer be accepting care from staff. He will be refusing vitals and medications. "There is no need to look for me any longer."  Q15 minute unit checks in place.

## 2022-06-07 NOTE — Plan of Care (Signed)
  Problem: Education: Goal: Knowledge of General Education information will improve Description: Including pain rating scale, medication(s)/side effects and non-pharmacologic comfort measures Outcome: Progressing   Problem: Safety: Goal: Ability to remain free from injury will improve Outcome: Progressing   Problem: Coping: Goal: Level of anxiety will decrease Outcome: Not Progressing

## 2022-06-08 NOTE — [facility] Group Notes (Signed)
[facility] Group Notes:  (Nursing/MHT/Case Management/Adjunct)  Date:  06/08/2022  Time:  4:20 PM  Type of Therapy:  Psychoeducational Skills  Participation Level:  Active  Participation Quality:  Appropriate and Attentive  Affect:  Appropriate  Cognitive:  Alert and Appropriate  Insight:  Appropriate  Engagement in Group:  Engaged  Modes of Intervention:  Activity and Socialization  Summary of Progress/Problems:  Matthew Gonzales 06/08/2022, 4:20 PM

## 2022-06-08 NOTE — Progress Notes (Signed)
[facility] MD Progress Note  06/08/2022 1:03 PM Matthew Gonzales  MRN:  [id number] Subjective: Patient refuses to take any medication.  He states he does not need any medication.  He asks if he can leave and I told him that he would have to wait till Monday. Principal Problem: Bipolar I disorder, most recent episode (or current) manic ([place]) Diagnosis: Principal Problem:   Bipolar I disorder, most recent episode (or current) manic ([place]) Active Problems:   Psychosis ([place])   Cannabis abuse   Hypertension  Total Time spent with patient: 15 minutes  Past Psychiatric History: Bipolar disorder  Past Medical History:  Past Medical History:  Diagnosis Date   Epidermal inclusion cyst    HSV-2 (herpes simplex virus 2) infection     Past Surgical History:  Procedure Laterality Date   CYST EXCISION  05/09/15   Epidermal Inclusion Cyst- Back (Dr. Azalee Course)   vertical sleeve gastrectomy     Family History:  Family History  Problem Relation Age of Onset   Hypothyroidism Mother    Hyperlipidemia Father    Pancreatic cancer Maternal Grandmother    Stroke Paternal Grandmother    Cataracts Paternal Grandfather    Family Psychiatric  History: Unremarkable Social History:  Social History   Substance and Sexual Activity  Alcohol Use Yes   Alcohol/week: 0.0 standard drinks of alcohol   Comment: rarely     Social History   Substance and Sexual Activity  Drug Use Yes   Types: Marijuana   Comment: daily cannabis use    Social History   Socioeconomic History   Marital status: Single    Spouse name: Not on file   Number of children: Not on file   Years of education: Not on file   Highest education level: Not on file  Occupational History   Not on file  Tobacco Use   Smoking status: Former    Packs/day: 0.50    Types: Cigarettes   Smokeless tobacco: Never  Vaping Use   Vaping Use: Every day  Substance and Sexual Activity   Alcohol use: Yes    Alcohol/week: 0.0 standard drinks of  alcohol    Comment: rarely   Drug use: Yes    Types: Marijuana    Comment: daily cannabis use   Sexual activity: Yes  Other Topics Concern   Not on file  Social History Narrative   Not on file   Social Determinants of Health   Financial Resource Strain: Not on file  Food Insecurity: No Food Insecurity (05/29/2022)   Hunger Vital Sign    Worried About Running Out of Food in the Last Year: Never true    Ran Out of Food in the Last Year: Never true  Transportation Needs: No Transportation Needs (05/29/2022)   PRAPARE - Hydrologist (Medical): No    Lack of Transportation (Non-Medical): No  Physical Activity: Not on file  Stress: Not on file  Social Connections: Not on file   Additional Social History:                         Sleep: Fair  Appetite:  Fair  Current Medications: Current Facility-Administered Medications  Medication Dose Route Frequency Provider Last Rate Last Admin   acetaminophen (TYLENOL) tablet 650 mg  650 mg Oral Q6H PRN Clapacs, Madie Ren[place], MD   650 mg at 06/07/22 0806   acyclovir (ZOVIRAX) 200 MG capsule 400 mg  400 mg Oral  BID Gillermo Murdoch, NP   400 mg at 06/07/22 1623   alum & mag hydroxide-simeth (MAALOX/MYLANTA) 200-200-20 MG/5ML suspension 30 mL  30 mL Oral Q4H PRN Gillermo Murdoch, NP       atorvastatin (LIPITOR) tablet 40 mg  40 mg Oral Daily Gillermo Murdoch, NP   40 mg at 06/07/22 0805   cloNIDine (CATAPRES) tablet 0.2 mg  0.2 mg Oral Once Gillermo Murdoch, NP       docusate sodium (COLACE) capsule 100 mg  100 mg Oral BID Clapacs, John T, MD   100 mg at 06/06/22 1657   hydrOXYzine (ATARAX) tablet 50 mg  50 mg Oral Q6H PRN Clapacs, Jackquline Denmark, MD   50 mg at 06/07/22 2202   ibuprofen (ADVIL) tablet 800 mg  800 mg Oral Q12H PRN Gillermo Murdoch, NP   800 mg at 06/07/22 1707   lisinopril (ZESTRIL) tablet 10 mg  10 mg Oral Daily Clapacs, John T, MD   10 mg at 06/07/22 0805   magnesium hydroxide (MILK  OF MAGNESIA) suspension 30 mL  30 mL Oral Daily PRN Gillermo Murdoch, NP   30 mL at 06/03/22 1029   metaxalone (SKELAXIN) tablet 800 mg  800 mg Oral TID PRN Gillermo Murdoch, NP       nicotine (NICODERM CQ - dosed in mg/24 hours) patch 14 mg  14 mg Transdermal Daily Clapacs, Jackquline Denmark, MD   14 mg at 06/07/22 0809   nicotine polacrilex (NICORETTE) gum 2 mg  2 mg Oral PRN Clapacs, Jackquline Denmark, MD   2 mg at 06/07/22 2203   OLANZapine zydis (ZYPREXA) disintegrating tablet 10 mg  10 mg Oral Q8H PRN Clapacs, Jackquline Denmark, MD   10 mg at 06/07/22 2202   propranolol (INDERAL) tablet 20 mg  20 mg Oral QHS Clapacs, John T, MD   20 mg at 06/07/22 2201   QUEtiapine (SEROQUEL) tablet 400 mg  400 mg Oral QHS Clapacs, John T, MD   400 mg at 06/07/22 2201   senna-docusate (Senokot-S) tablet 2 tablet  2 tablet Oral Daily PRN Clapacs, Jackquline Denmark, MD   2 tablet at 06/03/22 1118   ziprasidone (GEODON) injection 20 mg  20 mg Intramuscular Q12H PRN Clapacs, Jackquline Denmark, MD   20 mg at 06/01/22 1951    Lab Results: No results found for this or any previous visit (from the past 48 hour(s)).  Blood Alcohol level:  Lab Results  Component Value Date   ETH <10 05/29/2022    Metabolic Disorder Labs: Lab Results  Component Value Date   HGBA1C 5.1 05/31/2022   MPG 99.67 05/31/2022   No results found for: "PROLACTIN" Lab Results  Component Value Date   CHOL 138 05/31/2022   TRIG 112 05/31/2022   HDL 28 (L) 05/31/2022   CHOLHDL 4.9 05/31/2022   VLDL 22 05/31/2022   LDLCALC 88 05/31/2022    Physical Findings: AIMS:  , ,  ,  ,    CIWA:    COWS:     Musculoskeletal: Strength & Muscle Tone: within normal limits Gait & Station: normal Patient leans: N/A  Psychiatric Specialty Exam:  Presentation  General Appearance:  Bizarre  Eye Contact: Good  Speech: Clear and Coherent  Speech Volume: Normal  Handedness: Right   Mood and Affect  Mood: Dysphoric  Affect: Inappropriate   Thought Process  Thought  Processes: Disorganized; Irrevelant  Descriptions of Associations:Loose  Orientation:Partial  Thought Content:Illogical  History of Schizophrenia/Schizoaffective disorder:No  Duration of Psychotic Symptoms:Less than  six months  Hallucinations:No data recorded Ideas of Reference:Paranoia  Suicidal Thoughts:No data recorded Homicidal Thoughts:No data recorded  Sensorium  Memory: Immediate Poor  Judgment: Impaired  Insight: Poor   Executive Functions  Concentration: Poor  Attention Span: Poor  Recall: Poor  Fund of Knowledge: Poor  Language: Poor   Psychomotor Activity  Psychomotor Activity:No data recorded  Assets  Assets: Desire for Improvement; Housing; Physical Health; Resilience; Social Support   Sleep  Sleep:No data recorded   Physical Exam: Physical Exam Vitals and nursing note reviewed.  Constitutional:      Appearance: Normal appearance. He is normal weight.  Neurological:     General: No focal deficit present.     Mental Status: He is alert and oriented to person, place, and time.  Psychiatric:        Mood and Affect: Mood normal.        Behavior: Behavior normal.    Review of Systems  Constitutional: Negative.   HENT: Negative.    Eyes: Negative.   Respiratory: Negative.    Cardiovascular: Negative.   Gastrointestinal: Negative.   Genitourinary: Negative.   Musculoskeletal: Negative.   Skin: Negative.   Neurological: Negative.   Endo/Heme/Allergies: Negative.   Psychiatric/Behavioral: Negative.     Blood pressure 138/76, pulse 97, temperature 99.1 F (37.3 C), temperature source Oral, resp. rate 18, height 5\' 10"  (1.778 m), weight 95.3 kg, SpO2 100 %. Body mass index is 30.15 kg/m.   Treatment Plan Summary: Daily contact with patient to assess and evaluate symptoms and progress in treatment, Medication management, and Plan continue current medications.  Par[place] Ranger, DO 06/08/2022, 1:03 PM

## 2022-06-08 NOTE — Progress Notes (Signed)
Told staff that starting tomorrow, he will not be taking any more medication or allowing staff to take vitals. Per him, all treatment for him needs to cease. Very manipulative on the unit telling patients what to do or what to ask for. Like items from the supply room or medications they should ask for. He denies si/hi//avh depression and anxiety.  Will continue to monitor with q15 minute safety checks.    C Butler-Nicholson, LPN

## 2022-06-08 NOTE — Progress Notes (Signed)
Patient refused his evening medication. When this writer went to get him he stated "no psych meds". This Probation officer stated that he didn't have any psych meds scheduled at this time and he still refused.

## 2022-06-08 NOTE — Progress Notes (Signed)
Patient's fiance Nori Riis, called and stated that she is concerned about patient being discharged because of his potential to not follow-up. Patient's fiance stated that she has left her information for someone to call her back and has not heard from anyone.

## 2022-06-08 NOTE — Plan of Care (Signed)
D- Patient alert and oriented. Patient presented in a labile mood on assessment reporting that he slept "good" last night and had no complaints to voice to this Probation officer. Patient denied SI, HI, AVH, and pain at this time. Patient also denied signs/symptoms of depression and anxiety. Patient's goal for today is "I would like to either be released, moved to a new hospital or [facility]", in which he will "wait and see, will be suing the department and hospital".  A- Patient refused scheduled medications. Support and encouragement provided.  Routine safety checks conducted every 15 minutes.  Patient informed to notify staff with problems or concerns.  R- Patient contracts for safety at this time. Patient interacts well with others on the unit.  Patient remains safe at this time.  Problem: Education: Goal: Knowledge of General Education information will improve Description: Including pain rating scale, medication(s)/side effects and non-pharmacologic comfort measures Outcome: Not Progressing   Problem: Health Behavior/Discharge Planning: Goal: Ability to manage health-related needs will improve Outcome: Not Progressing   Problem: Clinical Measurements: Goal: Ability to maintain clinical measurements within normal limits will improve Outcome: Not Progressing Goal: Will remain free from infection Outcome: Not Progressing Goal: Diagnostic test results will improve Outcome: Not Progressing Goal: Respiratory complications will improve Outcome: Not Progressing Goal: Cardiovascular complication will be avoided Outcome: Not Progressing   Problem: Activity: Goal: Risk for activity intolerance will decrease Outcome: Not Progressing   Problem: Nutrition: Goal: Adequate nutrition will be maintained Outcome: Not Progressing   Problem: Coping: Goal: Level of anxiety will decrease Outcome: Not Progressing   Problem: Elimination: Goal: Will not experience complications related to  bowel motility Outcome: Not Progressing Goal: Will not experience complications related to urinary retention Outcome: Not Progressing   Problem: Pain Managment: Goal: General experience of comfort will improve Outcome: Not Progressing   Problem: Safety: Goal: Ability to remain free from injury will improve Outcome: Not Progressing   Problem: Skin Integrity: Goal: Risk for impaired skin integrity will decrease Outcome: Not Progressing   Problem: Education: Goal: Knowledge of [place] [facility] Education information/materials will improve Outcome: Not Progressing Goal: Emotional status will improve Outcome: Not Progressing Goal: Mental status will improve Outcome: Not Progressing Goal: Verbalization of understanding the information provided will improve Outcome: Not Progressing   Problem: Activity: Goal: Interest or engagement in activities will improve Outcome: Not Progressing Goal: Sleeping patterns will improve Outcome: Not Progressing   Problem: Coping: Goal: Ability to verbalize frustrations and anger appropriately will improve Outcome: Not Progressing Goal: Ability to demonstrate self-control will improve Outcome: Not Progressing   Problem: Health Behavior/Discharge Planning: Goal: Identification of resources available to assist in meeting health care needs will improve Outcome: Not Progressing Goal: Compliance with treatment plan for underlying cause of condition will improve Outcome: Not Progressing   Problem: Physical Regulation: Goal: Ability to maintain clinical measurements within normal limits will improve Outcome: Not Progressing   Problem: Safety: Goal: Periods of time without injury will increase Outcome: Not Progressing

## 2022-06-08 NOTE — Progress Notes (Signed)
Patient's parents are up in the [place] insisting to speak to the doctor and stating that they are not leaving until they do. Patient just gave consent for the doctor to speak to his parents.

## 2022-06-08 NOTE — Progress Notes (Signed)
Patient refused to take any medication. MD will be notified during progression rounds.

## 2022-06-08 NOTE — Plan of Care (Signed)
  Problem: Education: Goal: Knowledge of General Education information will improve Description: Including pain rating scale, medication(s)/side effects and non-pharmacologic comfort measures Outcome: Progressing   Problem: Health Behavior/Discharge Planning: Goal: Ability to manage health-related needs will improve Outcome: Progressing   Problem: Clinical Measurements: Goal: Ability to maintain clinical measurements within normal limits will improve Outcome: Progressing Goal: Will remain free from infection Outcome: Progressing Goal: Diagnostic test results will improve Outcome: Progressing Goal: Respiratory complications will improve Outcome: Progressing Goal: Cardiovascular complication will be avoided Outcome: Progressing   Problem: Physical Regulation: Goal: Ability to maintain clinical measurements within normal limits will improve Outcome: Progressing   Problem: Health Behavior/Discharge Planning: Goal: Identification of resources available to assist in meeting health care needs will improve Outcome: Progressing Goal: Compliance with treatment plan for underlying cause of condition will improve Outcome: Progressing   Problem: Coping: Goal: Ability to verbalize frustrations and anger appropriately will improve Outcome: Progressing Goal: Ability to demonstrate self-control will improve Outcome: Progressing

## 2022-06-08 NOTE — Plan of Care (Signed)
  Problem: Activity: Goal: Interest or engagement in activities will improve Outcome: Progressing   

## 2022-06-09 NOTE — Progress Notes (Signed)
[facility] MD Progress Note  06/09/2022 10:52 AM Matthew Gonzales  MRN:  [id number] Subjective: Patient is seen on rounds.  I received numerous phone calls yesterday in the afternoon that the parents wanted to talk to me.  Initially I did not have permission but they obtained it from him.  I will try calling them today.  He has been pleasant and cooperative on the unit.  He states he does not need any medication.  He says that his parents want him to come home but I believe they were part of the commitment so I am not sure he is willing to wait for Dr. Weber Cooks tomorrow.  Principal Problem: Bipolar I disorder, most recent episode (or current) manic ([place]) Diagnosis: Principal Problem:   Bipolar I disorder, most recent episode (or current) manic ([place]) Active Problems:   Psychosis ([place])   Cannabis abuse   Hypertension  Total Time spent with patient: 15 minutes  Past Psychiatric History: Unknown  Past Medical History:  Past Medical History:  Diagnosis Date   Epidermal inclusion cyst    HSV-2 (herpes simplex virus 2) infection     Past Surgical History:  Procedure Laterality Date   CYST EXCISION  05/09/15   Epidermal Inclusion Cyst- Back (Dr. Azalee Course)   vertical sleeve gastrectomy     Family History:  Family History  Problem Relation Age of Onset   Hypothyroidism Mother    Hyperlipidemia Father    Pancreatic cancer Maternal Grandmother    Stroke Paternal Grandmother    Cataracts Paternal Grandfather    Family Psychiatric  History: Unremarkable Social History:  Social History   Substance and Sexual Activity  Alcohol Use Yes   Alcohol/week: 0.0 standard drinks of alcohol   Comment: rarely     Social History   Substance and Sexual Activity  Drug Use Yes   Types: Marijuana   Comment: daily cannabis use    Social History   Socioeconomic History   Marital status: Single    Spouse name: Not on file   Number of children: Not on file   Years of education: Not on file   Highest  education level: Not on file  Occupational History   Not on file  Tobacco Use   Smoking status: Former    Packs/day: 0.50    Types: Cigarettes   Smokeless tobacco: Never  Vaping Use   Vaping Use: Every day  Substance and Sexual Activity   Alcohol use: Yes    Alcohol/week: 0.0 standard drinks of alcohol    Comment: rarely   Drug use: Yes    Types: Marijuana    Comment: daily cannabis use   Sexual activity: Yes  Other Topics Concern   Not on file  Social History Narrative   Not on file   Social Determinants of Health   Financial Resource Strain: Not on file  Food Insecurity: No Food Insecurity (05/29/2022)   Hunger Vital Sign    Worried About Running Out of Food in the Last Year: Never true    Ran Out of Food in the Last Year: Never true  Transportation Needs: No Transportation Needs (05/29/2022)   PRAPARE - Hydrologist (Medical): No    Lack of Transportation (Non-Medical): No  Physical Activity: Not on file  Stress: Not on file  Social Connections: Not on file   Additional Social History:  Sleep: Good  Appetite:  Good  Current Medications: Current Facility-Administered Medications  Medication Dose Route Frequency Provider Last Rate Last Admin   acetaminophen (TYLENOL) tablet 650 mg  650 mg Oral Q6H PRN Clapacs, John T, MD   650 mg at 06/07/22 0806   acyclovir (ZOVIRAX) 200 MG capsule 400 mg  400 mg Oral BID Caroline Sauger, NP   400 mg at 06/09/22 0828   alum & mag hydroxide-simeth (MAALOX/MYLANTA) 200-200-20 MG/5ML suspension 30 mL  30 mL Oral Q4H PRN Caroline Sauger, NP       atorvastatin (LIPITOR) tablet 40 mg  40 mg Oral Daily Caroline Sauger, NP   40 mg at 06/09/22 2992   cloNIDine (CATAPRES) tablet 0.2 mg  0.2 mg Oral Once Caroline Sauger, NP       docusate sodium (COLACE) capsule 100 mg  100 mg Oral BID Clapacs, Madie Reno, MD   100 mg at 06/09/22 4268   hydrOXYzine (ATARAX) tablet  50 mg  50 mg Oral Q6H PRN Clapacs, Madie Reno, MD   50 mg at 06/07/22 2202   ibuprofen (ADVIL) tablet 800 mg  800 mg Oral Q12H PRN Caroline Sauger, NP   800 mg at 06/09/22 0211   lisinopril (ZESTRIL) tablet 10 mg  10 mg Oral Daily Clapacs, John T, MD   10 mg at 06/09/22 3419   magnesium hydroxide (MILK OF MAGNESIA) suspension 30 mL  30 mL Oral Daily PRN Caroline Sauger, NP   30 mL at 06/03/22 1029   metaxalone (SKELAXIN) tablet 800 mg  800 mg Oral TID PRN Caroline Sauger, NP       nicotine (NICODERM CQ - dosed in mg/24 hours) patch 14 mg  14 mg Transdermal Daily Clapacs, Madie Reno, MD   14 mg at 06/09/22 6222   nicotine polacrilex (NICORETTE) gum 2 mg  2 mg Oral PRN Clapacs, Madie Reno, MD   2 mg at 06/09/22 0829   OLANZapine zydis (ZYPREXA) disintegrating tablet 10 mg  10 mg Oral Q8H PRN Clapacs, Madie Reno, MD   10 mg at 06/07/22 2202   propranolol (INDERAL) tablet 20 mg  20 mg Oral QHS Clapacs, John T, MD   20 mg at 06/09/22 0211   QUEtiapine (SEROQUEL) tablet 400 mg  400 mg Oral QHS Clapacs, John T, MD   400 mg at 06/07/22 2201   senna-docusate (Senokot-S) tablet 2 tablet  2 tablet Oral Daily PRN Clapacs, Madie Reno, MD   2 tablet at 06/03/22 1118   ziprasidone (GEODON) injection 20 mg  20 mg Intramuscular Q12H PRN Clapacs, Madie Reno, MD   20 mg at 06/01/22 1951    Lab Results: No results found for this or any previous visit (from the past 63 hour(s)).  Blood Alcohol level:  Lab Results  Component Value Date   ETH <10 97/98/9211    Metabolic Disorder Labs: Lab Results  Component Value Date   HGBA1C 5.1 05/31/2022   MPG 99.67 05/31/2022   No results found for: "PROLACTIN" Lab Results  Component Value Date   CHOL 138 05/31/2022   TRIG 112 05/31/2022   HDL 28 (L) 05/31/2022   CHOLHDL 4.9 05/31/2022   VLDL 22 05/31/2022   LDLCALC 88 05/31/2022    Physical Findings: AIMS:  , ,  ,  ,    CIWA:    COWS:     Musculoskeletal: Strength & Muscle Tone: within normal limits Gait &  Station: normal Patient leans: N/A  Psychiatric Specialty Exam:  Presentation  General  Appearance:  Bizarre  Eye Contact: Good  Speech: Clear and Coherent  Speech Volume: Normal  Handedness: Right   Mood and Affect  Mood: Dysphoric  Affect: Inappropriate   Thought Process  Thought Processes: Disorganized; Irrevelant  Descriptions of Associations:Loose  Orientation:Partial  Thought Content:Illogical  History of Schizophrenia/Schizoaffective disorder:No  Duration of Psychotic Symptoms:Less than six months  Hallucinations:No data recorded Ideas of Reference:Paranoia  Suicidal Thoughts:No data recorded Homicidal Thoughts:No data recorded  Sensorium  Memory: Immediate Poor  Judgment: Impaired  Insight: Poor   Executive Functions  Concentration: Poor  Attention Span: Poor  Recall: Poor  Fund of Knowledge: Poor  Language: Poor   Psychomotor Activity  Psychomotor Activity:No data recorded  Assets  Assets: Desire for Improvement; Housing; Physical Health; Resilience; Social Support   Sleep  Sleep:No data recorded   Physical Exam: Physical Exam Vitals and nursing note reviewed.  Constitutional:      Appearance: Normal appearance. He is normal weight.  Neurological:     General: No focal deficit present.     Mental Status: He is alert and oriented to person, place, and time.  Psychiatric:        Mood and Affect: Mood normal.        Behavior: Behavior normal.    Review of Systems  Constitutional: Negative.   HENT: Negative.    Eyes: Negative.   Respiratory: Negative.    Cardiovascular: Negative.   Gastrointestinal: Negative.   Genitourinary: Negative.   Musculoskeletal: Negative.   Skin: Negative.   Neurological: Negative.   Endo/Heme/Allergies: Negative.   Psychiatric/Behavioral: Negative.     Blood pressure (!) 117/94, pulse 82, temperature 98.5 F (36.9 C), temperature source Oral, resp. rate 18, height 5'  10" (1.778 m), weight 95.3 kg, SpO2 99 %. Body mass index is 30.15 kg/m.   Treatment Plan Summary: Daily contact with patient to assess and evaluate symptoms and progress in treatment, Medication management, and Plan continue current medications.  Sarina Ill, DO 06/09/2022, 10:52 AM

## 2022-06-09 NOTE — Progress Notes (Signed)
Patient is refusing his qhs meds this evening. Reports that he will not be taking any more psych medications. He denies si/hi/avh  depression and anxiety at this encounter.   C Butler-Nicholson, LPN

## 2022-06-09 NOTE — Progress Notes (Signed)
Patient was at the nurses station asking for his medical meds. Continues to refuse his psych meds. Much more pleasant and cooperative than normal. Medications provided without incident. Will continue to monitor with q15 minute safety checks.    Genia Del, LPN

## 2022-06-09 NOTE — Plan of Care (Signed)
D: Patient alert and oriented. Patient denies pain. Patient denies anxiety and depression. Patient denies SI/HI/AVH. Patient complains of motion sickness this morning.  Patient did start morning assessment stating the he did not want to take any psych medication but would take other scheduled and PRN medications. Patient endorsed headache 7/10. Patient requested PRN Advil for headache. PRN medication given. When reassessed patient states that the Advil did not help and asked for PRN tylenol. PRN tylenol given to patient with scheduled 1700 medication. Patient has been interactive with peers on the unit and came out to the dayroom to watch football game with peers.  A: Scheduled medications administered to patient, per MD orders.  Support and encouragement provided to patient.  Q15 minute safety checks maintained.   R: Patient compliant with medication administration and treatment plan. No adverse drug reactions noted. Patient remains safe on the unit at this time. Problem: Education: Goal: Knowledge of [facility] Education information/materials will improve Outcome: Progressing Goal: Mental status will improve Outcome: Progressing Goal: Verbalization of understanding the information provided will improve Outcome: Progressing   Problem: Health Behavior/Discharge Planning: Goal: Compliance with treatment plan for underlying cause of condition will improve Outcome: Progressing   Problem: Physical Regulation: Goal: Ability to maintain clinical measurements within normal limits will improve Outcome: Progressing   Problem: Safety: Goal: Periods of time without injury will increase Outcome: Progressing

## 2022-06-10 LAB — BASIC METABOLIC PANEL
Anion gap: 9 (ref 5–15)
BUN: 10 mg/dL (ref 6–20)
CO2: 28 mmol/L (ref 22–32)
Calcium: 9.1 mg/dL (ref 8.9–10.3)
Chloride: 105 mmol/L (ref 98–111)
Creatinine, Ser: 0.92 mg/dL (ref 0.61–1.24)
GFR, Estimated: >60 mL/min (ref >60)
Glucose, Bld: 105 mg/dL — ABNORMAL HIGH (ref 70–99)
Potassium: 4.7 mmol/L (ref 3.5–5.1)
Sodium: 142 mmol/L (ref 135–145)

## 2022-06-10 MED ORDER — NICOTINE POLACRILEX 2 MG MT GUM: 2.0000 mg | CHEWING_GUM | OROMUCOSAL | 1 refills | Status: AC | PRN

## 2022-06-10 MED ORDER — LISINOPRIL 10 MG PO TABS: 10.0000 mg | ORAL_TABLET | Freq: Every day | ORAL | 1 refills | Status: AC

## 2022-06-10 MED ORDER — ATORVASTATIN CALCIUM 40 MG PO TABS: 40.0000 mg | ORAL_TABLET | Freq: Every day | ORAL | 1 refills | Status: AC

## 2022-06-10 MED ORDER — QUETIAPINE FUMARATE 400 MG PO TABS: 400.0000 mg | ORAL_TABLET | Freq: Every day | ORAL | 1 refills | Status: AC

## 2022-06-10 MED ORDER — ACYCLOVIR 200 MG PO CAPS: 400.0000 mg | ORAL_CAPSULE | Freq: Two times a day (BID) | ORAL | 1 refills | Status: AC

## 2022-06-10 MED ORDER — DOCUSATE SODIUM 100 MG PO CAPS: 100.0000 mg | ORAL_CAPSULE | Freq: Two times a day (BID) | ORAL | 1 refills | Status: AC

## 2022-06-10 MED ORDER — METAXALONE 800 MG PO TABS: 800.0000 mg | ORAL_TABLET | Freq: Three times a day (TID) | ORAL | 1 refills | Status: AC | PRN

## 2022-06-10 MED ORDER — PROPRANOLOL HCL 20 MG PO TABS: 20.0000 mg | ORAL_TABLET | Freq: Every day | ORAL | 1 refills | Status: AC

## 2022-06-10 MED ORDER — NICOTINE 14 MG/24HR TD PT24: 14.0000 mg | MEDICATED_PATCH | Freq: Every day | TRANSDERMAL | 1 refills | Status: AC

## 2022-06-10 NOTE — Progress Notes (Signed)
  [facility] Adult Case Management Discharge Plan :  Will you be returning to the same living situation after discharge:  Yes,  Patient to discharge to parents home before returning home to residence in [place].  At discharge, do you have transportation home?: Yes,  Parents to provide transportation from hospital.  Do you have the ability to pay for your medications: No. Patient has no listed insurance, to be provided with 7 day supply of medication at discharge. CSW has provided patient with clinic information for free/reduced medications.   Release of information consent forms completed and in the chart;  Patient's signature needed at discharge.  Patient to Follow up at:  [facility]. Go to.   Why: A referral has been made to this clinic. Please present for new patient walk in hours at 0830 Mon-Fri to establish psychiatry services. Contact information: [address] [place], [place] [address] [phone]                Next level of care provider has access to [place] and Suicide Prevention discussed: Yes,  SPE completed wit patient and  Matthew Gonzales, mother, [phone]   Has patient been referred to the Quitline?: Patient refused referral Tobacco Use: Medium Risk (05/30/2022)   Patient History    Smoking Tobacco Use: Former    Smokeless Tobacco Use: Never    Passive Exposure: Not on file   Patient has been referred for addiction treatment: [facility] use [facility]. Clinic operates on a new patient walk in clinic, unable to obtain appointment. Information for clinic to include hours of operation noted in discharge instructions, see above.   Matthew Gonzales, LCSWA 06/10/2022, 10:17 AM

## 2022-06-10 NOTE — Progress Notes (Signed)
He is pleasant and much easier to engage in conversation.  He refused his QHS mental health medication again tonight. Took all other medication along with a nicorette gum.  He denies si/hi/avh depression and anxiety at this encounter. Encouraged him to come to the nurses station with any concerns that he may have.    C Butler-Nicholson, LPN

## 2022-06-10 NOTE — Group Note (Signed)
[facility] LCSW Group Therapy Note    Group Date: 06/10/2022 Start Time: 1300 End Time: 1400  Type of Therapy and Topic:  Group Therapy:  Overcoming Obstacles  Participation Level:  [facility] PARTICIPATION LEVEL: Active  Mood: Euthymic   Description of Group:   In this group patients will be encouraged to explore what they see as obstacles to their own wellness and recovery. They will be guided to discuss their thoughts, feelings, and behaviors related to these obstacles. The group will process together ways to cope with barriers, with attention given to specific choices patients can make. Each patient will be challenged to identify changes they are motivated to make in order to overcome their obstacles. This group will be process-oriented, with patients participating in exploration of their own experiences as well as giving and receiving support and challenge from other group members.  Therapeutic Goals: 1. Patient will identify personal and current obstacles as they relate to admission. 2. Patient will identify barriers that currently interfere with their wellness or overcoming obstacles.  3. Patient will identify feelings, thought process and behaviors related to these barriers. 4. Patient will identify two changes they are willing to make to overcome these obstacles:    Summary of Patient Progress   Patient was present for the entirety of the group session. Patient was an active listener and participated in the topic of discussion, provided helpful advice to others, and added nuance to topic of conversation. Patient shared his goal to overcome obstacles is simply to be less hard on himself.    Therapeutic Modalities:   Cognitive Behavioral Therapy Solution Focused Therapy Motivational Interviewing Relapse Prevention Therapy   [name], [place]

## 2022-06-10 NOTE — Discharge Summary (Signed)
Physician Discharge Summary Note  Patient:  Matthew Gonzales is an 32 y.o., male MRN:  [id number] DOB:  28-Mar-1991 Patient phone:  [phone] (home)  Patient address:   [place] [id number],  Total Time spent with patient: 30 minutes  Date of Admission:  05/29/2022 Date of Discharge: 06/10/2022  Reason for Admission: Patient admitted with symptoms consistent with bipolar disorder including hyperactivity grandiosity and impulsivity disorganized thinking  Principal Problem: Bipolar I disorder, most recent episode (or current) manic ([place]) Discharge Diagnoses: Principal Problem:   Bipolar I disorder, most recent episode (or current) manic ([place]) Active Problems:   Psychosis ([place])   Cannabis abuse   Hypertension   Past Psychiatric History: Past history of escalating behaviors consistent with bipolar probably over the last several weeks or months  Past Medical History:  Past Medical History:  Diagnosis Date   Epidermal inclusion cyst    HSV-2 (herpes simplex virus 2) infection     Past Surgical History:  Procedure Laterality Date   CYST EXCISION  05/09/15   Epidermal Inclusion Cyst- Back (Dr. Azalee Course)   vertical sleeve gastrectomy     Family History:  Family History  Problem Relation Age of Onset   Hypothyroidism Mother    Hyperlipidemia Father    Pancreatic cancer Maternal Grandmother    Stroke Paternal Grandmother    Cataracts Paternal Grandfather    Family Psychiatric  History: See above Social History:  Social History   Substance and Sexual Activity  Alcohol Use Yes   Alcohol/week: 0.0 standard drinks of alcohol   Comment: rarely     Social History   Substance and Sexual Activity  Drug Use Yes   Types: Marijuana   Comment: daily cannabis use    Social History   Socioeconomic History   Marital status: Single    Spouse name: Not on file   Number of children: Not on file   Years of education: Not on file   Highest education level: Not  on file  Occupational History   Not on file  Tobacco Use   Smoking status: Former    Packs/day: 0.50    Types: Cigarettes   Smokeless tobacco: Never  Vaping Use   Vaping Use: Every day  Substance and Sexual Activity   Alcohol use: Yes    Alcohol/week: 0.0 standard drinks of alcohol    Comment: rarely   Drug use: Yes    Types: Marijuana    Comment: daily cannabis use   Sexual activity: Yes  Other Topics Concern   Not on file  Social History Narrative   Not on file   Social Determinants of Health   Financial Resource Strain: Not on file  Food Insecurity: No Food Insecurity (05/29/2022)   Hunger Vital Sign    Worried About Running Out of Food in the Last Year: Never true    Ran Out of Food in the Last Year: Never true  Transportation Needs: No Transportation Needs (05/29/2022)   PRAPARE - Hydrologist (Medical): No    Lack of Transportation (Non-Medical): No  Physical Activity: Not on file  Stress: Not on file  Social Connections: Not on file    Hospital Course: Patient admitted to psychiatric hospital.  Maintained on 15-minute checks.  Patient was initially agitated with a lot of pacing verbal agitation hyper verbality disorganized thinking.  No violence or threats to others while on the unit.  Gradually became more compliant with medicine and more accepting of  suggested diagnoses.  Tolerated medicines well although he has at this point declined to take lithium.  He says he will continue with the Seroquel as it is currently prescribed.  Patient is being discharged today with recommended local mental health follow-up and psychoeducation has been been completed about the nature of bipolar disorder and the importance of staying on medication and following up with outpatient treatment.  Physical Findings: AIMS:  , ,  ,  ,    CIWA:    COWS:     Musculoskeletal: Strength & Muscle Tone: within normal limits Gait & Station: normal Patient leans:  N/A   Psychiatric Specialty Exam:  Presentation  General Appearance:  Bizarre  Eye Contact: Good  Speech: Clear and Coherent  Speech Volume: Normal  Handedness: Right   Mood and Affect  Mood: Dysphoric  Affect: Inappropriate   Thought Process  Thought Processes: Disorganized; Irrevelant  Descriptions of Associations:Loose  Orientation:Partial  Thought Content:Illogical  History of Schizophrenia/Schizoaffective disorder:No  Duration of Psychotic Symptoms:Less than six months  Hallucinations:No data recorded Ideas of Reference:Paranoia  Suicidal Thoughts:No data recorded Homicidal Thoughts:No data recorded  Sensorium  Memory: Immediate Poor  Judgment: Impaired  Insight: Poor   Executive Functions  Concentration: Poor  Attention Span: Poor  Recall: Poor  Fund of Knowledge: Poor  Language: Poor   Psychomotor Activity  Psychomotor Activity:No data recorded  Assets  Assets: Desire for Improvement; Housing; Physical Health; Resilience; Social Support   Sleep  Sleep:No data recorded   Physical Exam: Physical Exam Vitals and nursing note reviewed.  Constitutional:      Appearance: Normal appearance.  HENT:     Head: Normocephalic and atraumatic.     Mouth/Throat:     Pharynx: Oropharynx is clear.  Eyes:     Pupils: Pupils are equal, round, and reactive to light.  Cardiovascular:     Rate and Rhythm: Normal rate and regular rhythm.  Pulmonary:     Effort: Pulmonary effort is normal.     Breath sounds: Normal breath sounds.  Abdominal:     General: Abdomen is flat.     Palpations: Abdomen is soft.  Musculoskeletal:        General: Normal range of motion.  Skin:    General: Skin is warm and dry.  Neurological:     General: No focal deficit present.     Mental Status: He is alert. Mental status is at baseline.  Psychiatric:        Attention and Perception: Attention normal.        Mood and Affect: Mood normal.         Speech: Speech normal.        Behavior: Behavior normal.        Thought Content: Thought content normal.        Cognition and Memory: Cognition normal.        Judgment: Judgment normal.    Review of Systems  Constitutional: Negative.   HENT: Negative.    Eyes: Negative.   Respiratory: Negative.    Cardiovascular: Negative.   Gastrointestinal: Negative.   Musculoskeletal: Negative.   Skin: Negative.   Neurological: Negative.   Psychiatric/Behavioral: Negative.     Blood pressure 110/73, pulse 77, temperature 98.9 F (37.2 C), temperature source Oral, resp. rate 16, height 5\' 10"  (1.778 m), weight 95.3 kg, SpO2 100 %. Body mass index is 30.15 kg/m.   Social History   Tobacco Use  Smoking Status Former   Packs/day: 0.50   Types:  Cigarettes  Smokeless Tobacco Never   Tobacco Cessation:  A prescription for an FDA-approved tobacco cessation medication provided at discharge   Blood Alcohol level:  Lab Results  Component Value Date   ETH <10 84/69/6295    Metabolic Disorder Labs:  Lab Results  Component Value Date   HGBA1C 5.1 05/31/2022   MPG 99.67 05/31/2022   No results found for: "PROLACTIN" Lab Results  Component Value Date   CHOL 138 05/31/2022   TRIG 112 05/31/2022   HDL 28 (L) 05/31/2022   CHOLHDL 4.9 05/31/2022   VLDL 22 05/31/2022   [place] 88 05/31/2022    See Psychiatric Specialty Exam and Suicide Risk Assessment completed by Attending Physician prior to discharge.  Discharge destination:  Home  Is patient on multiple antipsychotic therapies at discharge:  No   Has Patient had three or more failed trials of antipsychotic monotherapy by history:  No  Recommended Plan for Multiple Antipsychotic Therapies: NA  Discharge Instructions     Diet - low sodium heart healthy   Complete by: As directed    Increase activity slowly   Complete by: As directed       Allergies as of 06/10/2022   No Known Allergies      Medication List      STOP taking these medications    acyclovir 400 MG tablet Commonly known as: ZOVIRAX Replaced by: acyclovir 200 MG capsule   DULoxetine 30 MG capsule Commonly known as: CYMBALTA   escitalopram 20 MG tablet Commonly known as: LEXAPRO   ibuprofen 800 MG tablet Commonly known as: ADVIL   meloxicam 15 MG tablet Commonly known as: MOBIC       TAKE these medications      Indication  acyclovir 200 MG capsule Commonly known as: ZOVIRAX Take 2 capsules (400 mg total) by mouth 2 (two) times daily. Replaces: acyclovir 400 MG tablet  Indication: Herpes Simplex Infection   atorvastatin 40 MG tablet Commonly known as: LIPITOR Take 1 tablet (40 mg total) by mouth daily for 10 days.  Indication: High Amount of Fats in the Blood   docusate sodium 100 MG capsule Commonly known as: COLACE Take 1 capsule (100 mg total) by mouth 2 (two) times daily.  Indication: Constipation   lisinopril 10 MG tablet Commonly known as: ZESTRIL Take 1 tablet (10 mg total) by mouth daily.  Indication: High Blood Pressure Disorder   metaxalone 800 MG tablet Commonly known as: SKELAXIN Take 1 tablet (800 mg total) by mouth 3 (three) times daily as needed for muscle spasms.  Indication: Musculoskeletal Pain   nicotine 14 mg/24hr patch Commonly known as: NICODERM CQ - dosed in mg/24 hours Place 1 patch (14 mg total) onto the skin daily.  Indication: Nicotine Addiction   propranolol 20 MG tablet Commonly known as: INDERAL Take 1 tablet (20 mg total) by mouth at bedtime.  Indication: Tachycardia   QUEtiapine 400 MG tablet Commonly known as: SEROQUEL Take 1 tablet (400 mg total) by mouth at bedtime.  Indication: Manic Phase of Manic-Depression   SM Nicotine Polacrilex 2 MG gum Generic drug: nicotine polacrilex Take 1 each (2 mg total) by mouth as needed for smoking cessation.  Indication: Nicotine Addiction         Follow-up recommendations:  Other:  Prescription and supply of medicine  given.  Psychoeducation completed.  Referred to outpatient mental health treatment.  Comments: See above  Signed: Alethia Berthold, MD 06/10/2022, 10:01 AM

## 2022-06-10 NOTE — BH IP Treatment Plan (Signed)
Interdisciplinary Treatment and Diagnostic Plan Update  06/10/2022 Time of Session: 8:30AM Matthew Gonzales MRN: [id number]  Principal Diagnosis: Bipolar I disorder, most recent episode (or current) manic (HCC)  Secondary Diagnoses: Principal Problem:   Bipolar I disorder, most recent episode (or current) manic (HCC) Active Problems:   Psychosis (HCC)   Cannabis abuse   Hypertension   Current Medications:  Current Facility-Administered Medications  Medication Dose Route Frequency Provider Last Rate Last Admin   acetaminophen (TYLENOL) tablet 650 mg  650 mg Oral Q6H PRN Clapacs, John T, MD   650 mg at 06/09/22 1657   acyclovir (ZOVIRAX) 200 MG capsule 400 mg  400 mg Oral BID Gillermo Murdoch, NP   400 mg at 06/10/22 0821   alum & mag hydroxide-simeth (MAALOX/MYLANTA) 200-200-20 MG/5ML suspension 30 mL  30 mL Oral Q4H PRN Gillermo Murdoch, NP       atorvastatin (LIPITOR) tablet 40 mg  40 mg Oral Daily Gillermo Murdoch, NP   40 mg at 06/10/22 0820   cloNIDine (CATAPRES) tablet 0.2 mg  0.2 mg Oral Once Gillermo Murdoch, NP       docusate sodium (COLACE) capsule 100 mg  100 mg Oral BID Clapacs, Jackquline Denmark, MD   100 mg at 06/09/22 1657   hydrOXYzine (ATARAX) tablet 50 mg  50 mg Oral Q6H PRN Clapacs, Jackquline Denmark, MD   50 mg at 06/09/22 2120   ibuprofen (ADVIL) tablet 800 mg  800 mg Oral Q12H PRN Gillermo Murdoch, NP   800 mg at 06/10/22 0820   lisinopril (ZESTRIL) tablet 10 mg  10 mg Oral Daily Clapacs, Jackquline Denmark, MD   10 mg at 06/10/22 0820   magnesium hydroxide (MILK OF MAGNESIA) suspension 30 mL  30 mL Oral Daily PRN Gillermo Murdoch, NP   30 mL at 06/03/22 1029   metaxalone (SKELAXIN) tablet 800 mg  800 mg Oral TID PRN Gillermo Murdoch, NP       nicotine (NICODERM CQ - dosed in mg/24 hours) patch 14 mg  14 mg Transdermal Daily Clapacs, Jackquline Denmark, MD   14 mg at 06/10/22 0820   nicotine polacrilex (NICORETTE) gum 2 mg  2 mg Oral PRN Clapacs, Jackquline Denmark, MD   2 mg at 06/10/22 0824    OLANZapine zydis (ZYPREXA) disintegrating tablet 10 mg  10 mg Oral Q8H PRN Clapacs, Jackquline Denmark, MD   10 mg at 06/07/22 2202   propranolol (INDERAL) tablet 20 mg  20 mg Oral QHS Clapacs, John T, MD   20 mg at 06/09/22 2117   QUEtiapine (SEROQUEL) tablet 400 mg  400 mg Oral QHS Clapacs, John T, MD   400 mg at 06/07/22 2201   senna-docusate (Senokot-S) tablet 2 tablet  2 tablet Oral Daily PRN Clapacs, Jackquline Denmark, MD   2 tablet at 06/03/22 1118   ziprasidone (GEODON) injection 20 mg  20 mg Intramuscular Q12H PRN Clapacs, Jackquline Denmark, MD   20 mg at 06/01/22 1951   PTA Medications: Medications Prior to Admission  Medication Sig Dispense Refill Last Dose   acyclovir (ZOVIRAX) 400 MG tablet TAKE 1 TABLET BY MOUTH TWICE A DAY 180 tablet 0    DULoxetine (CYMBALTA) 30 MG capsule Take 30 mg by mouth at bedtime. (Patient not taking: Reported on 05/29/2022)      escitalopram (LEXAPRO) 20 MG tablet Take 20 mg by mouth daily.      ibuprofen (ADVIL) 800 MG tablet Take 800 mg by mouth every 12 (twelve) hours as needed for mild pain.  meloxicam (MOBIC) 15 MG tablet Take 1 tablet (15 mg total) by mouth daily as needed for pain. (Patient not taking: Reported on 05/29/2022) 30 tablet 0    [DISCONTINUED] atorvastatin (LIPITOR) 40 MG tablet Take 40 mg by mouth daily.      [DISCONTINUED] metaxalone (SKELAXIN) 800 MG tablet Take 1 tablet (800 mg total) by mouth 3 (three) times daily as needed for muscle spasms. 30 tablet 0     Patient Stressors: Medication change or noncompliance   Substance abuse    Patient Strengths: Ability for insight  Motivation for treatment/growth   Treatment Modalities: Medication Management, Group therapy, Case management,  1 to 1 session with clinician, Psychoeducation, Recreational therapy.   Physician Treatment Plan for Primary Diagnosis: Bipolar I disorder, most recent episode (or current) manic ([place]) Long Term Goal(s): Improvement in symptoms so as ready for discharge   Short Term Goals:  Ability to maintain clinical measurements within normal limits will improve Compliance with prescribed medications will improve Ability to verbalize feelings will improve Ability to demonstrate self-control will improve Ability to identify and develop effective coping behaviors will improve  Medication Management: Evaluate patient's response, side effects, and tolerance of medication regimen.  Therapeutic Interventions: 1 to 1 sessions, Unit Group sessions and Medication administration.  Evaluation of Outcomes: Progressing  Physician Treatment Plan for Secondary Diagnosis: Principal Problem:   Bipolar I disorder, most recent episode (or current) manic ([place]) Active Problems:   Psychosis ([place])   Cannabis abuse   Hypertension  Long Term Goal(s): Improvement in symptoms so as ready for discharge   Short Term Goals: Ability to maintain clinical measurements within normal limits will improve Compliance with prescribed medications will improve Ability to verbalize feelings will improve Ability to demonstrate self-control will improve Ability to identify and develop effective coping behaviors will improve     Medication Management: Evaluate patient's response, side effects, and tolerance of medication regimen.  Therapeutic Interventions: 1 to 1 sessions, Unit Group sessions and Medication administration.  Evaluation of Outcomes: Progressing   RN Treatment Plan for Primary Diagnosis: Bipolar I disorder, most recent episode (or current) manic ([place]) Long Term Goal(s): Knowledge of disease and therapeutic regimen to maintain health will improve  Short Term Goals: Ability to demonstrate self-control, Ability to participate in decision making will improve, Ability to verbalize feelings will improve, Ability to disclose and discuss suicidal ideas, Ability to identify and develop effective coping behaviors will improve, and Compliance with prescribed medications will improve  Medication  Management: RN will administer medications as ordered by provider, will assess and evaluate patient's response and provide education to patient for prescribed medication. RN will report any adverse and/or side effects to prescribing provider.  Therapeutic Interventions: 1 on 1 counseling sessions, Psychoeducation, Medication administration, Evaluate responses to treatment, Monitor vital signs and CBGs as ordered, Perform/monitor CIWA, COWS, AIMS and Fall Risk screenings as ordered, Perform wound care treatments as ordered.  Evaluation of Outcomes: Progressing   LCSW Treatment Plan for Primary Diagnosis: Bipolar I disorder, most recent episode (or current) manic ([place]) Long Term Goal(s): Safe transition to appropriate next level of care at discharge, Engage patient in therapeutic group addressing interpersonal concerns.  Short Term Goals: Engage patient in aftercare planning with referrals and resources, Increase social support, Increase ability to appropriately verbalize feelings, Increase emotional regulation, Facilitate acceptance of mental health diagnosis and concerns, and Increase skills for wellness and recovery  Therapeutic Interventions: Assess for all discharge needs, 1 to 1 time with Social worker, Explore available  resources and support systems, Assess for adequacy in community support network, Educate family and significant other(s) on suicide prevention, Complete Psychosocial Assessment, Interpersonal group therapy.  Evaluation of Outcomes: Progressing   Progress in Treatment: Attending groups: Yes. Participating in groups: No. Taking medication as prescribed: Yes. Toleration medication: Yes. Family/Significant other contact made: Yes, individual(s) contacted:  SPE completed with the patient and patient's mother Patient understands diagnosis: Yes. Discussing patient identified problems/goals with staff: Yes. Medical problems stabilized or resolved: Yes. Denies suicidal/homicidal  ideation: Yes. Issues/concerns per patient self-inventory: No. Other: none  New problem(s) identified: No, Describe:  none identified  Update 06/05/2022:  No changes at this time.  Update 06/10/2022:  No changes at this time.    New Short Term/Long Term Goal(s): elimination of symptoms of psychosis, medication management for mood stabilization; elimination of SI thoughts; development of comprehensive mental wellness plan.  Update 06/05/2022:  No changes at this time.  Update 06/10/2022:  No changes at this time.   Patient Goals:  Pt declined to participate in the treatment team meeting despite personal invitation from the nurse.  Update 06/05/2022:  No changes at this time.  Update 06/10/2022:  No changes at this time.   Discharge Plan or Barriers: CSW will assist pt with development of an appropriate aftercare/discharge plan. Update 06/05/2022:  Patient is beginning to clear and appears able to participate in discussions on aftercare plans.  CSW to follow up.  Update 06/10/2022:  No changes at this time.   Reason for Continuation of Hospitalization: Delusions  Hallucinations Medication stabilization Suicidal ideation Other; describe Psychosis   Estimated Length of Stay: 1-7 days Update 06/05/2022:  No changes at this time.  Update 06/10/2022:  No changes at this time.  Last 3 [place] Suicide Severity Risk Score: Flowsheet Row Admission (Current) from 05/29/2022 in [place] Most recent reading at 05/29/2022  9:58 PM ED from 05/29/2022 in [facility] Emergency Department at [facility] Most recent reading at 05/29/2022  1:59 PM  C-SSRS RISK CATEGORY No Risk No Risk       Last PHQ 2/9 Scores:     No data to display          Scribe for Treatment Team: Rozann Lesches, LCSW 06/10/2022 8:52 AM

## 2022-06-10 NOTE — Plan of Care (Signed)
  Problem: Education: Goal: Knowledge of General Education information will improve Description: Including pain rating scale, medication(s)/side effects and non-pharmacologic comfort measures Outcome: Progressing   Problem: Health Behavior/Discharge Planning: Goal: Ability to manage health-related needs will improve Outcome: Progressing   Problem: Clinical Measurements: Goal: Ability to maintain clinical measurements within normal limits will improve Outcome: Progressing Goal: Will remain free from infection Outcome: Progressing Goal: Diagnostic test results will improve Outcome: Progressing Goal: Respiratory complications will improve Outcome: Progressing Goal: Cardiovascular complication will be avoided Outcome: Progressing   Problem: Safety: Goal: Periods of time without injury will increase Outcome: Progressing   Problem: Physical Regulation: Goal: Ability to maintain clinical measurements within normal limits will improve Outcome: Progressing   Problem: Health Behavior/Discharge Planning: Goal: Identification of resources available to assist in meeting health care needs will improve Outcome: Progressing Goal: Compliance with treatment plan for underlying cause of condition will improve Outcome: Progressing   

## 2022-06-10 NOTE — Progress Notes (Signed)
Patient pleasant and cooperative on approach. Denies SI,HI and AVH. Verbalized understanding discharge instructions,prescriptions and follow up care. 7 days medicines given to patient. All belongings returned from [employer]. Patient escorted out by staff and transported by family.

## 2022-06-10 NOTE — BHH Suicide Risk Assessment (Signed)
[facility] Discharge Suicide Risk Assessment   Principal Problem: Bipolar I disorder, most recent episode (or current) manic ([place]) Discharge Diagnoses: Principal Problem:   Bipolar I disorder, most recent episode (or current) manic ([place]) Active Problems:   Psychosis ([place])   Cannabis abuse   Hypertension   Total Time spent with patient: 30 minutes  Musculoskeletal: Strength & Muscle Tone: within normal limits Gait & Station: normal Patient leans: N/A  Psychiatric Specialty Exam  Presentation  General Appearance:  Bizarre  Eye Contact: Good  Speech: Clear and Coherent  Speech Volume: Normal  Handedness: Right   Mood and Affect  Mood: Dysphoric  Duration of Depression Symptoms: No data recorded Affect: Inappropriate   Thought Process  Thought Processes: Disorganized; Irrevelant  Descriptions of Associations:Loose  Orientation:Partial  Thought Content:Illogical  History of Schizophrenia/Schizoaffective disorder:No  Duration of Psychotic Symptoms:Less than six months  Hallucinations:No data recorded Ideas of Reference:Paranoia  Suicidal Thoughts:No data recorded Homicidal Thoughts:No data recorded  Sensorium  Memory: Immediate Poor  Judgment: Impaired  Insight: Poor   Executive Functions  Concentration: Poor  Attention Span: Poor  Recall: Poor  Fund of Knowledge: Poor  Language: Poor   Psychomotor Activity  Psychomotor Activity:No data recorded  Assets  Assets: Desire for Improvement; Housing; Physical Health; Resilience; Social Support   Sleep  Sleep:No data recorded  Physical Exam: Physical Exam Vitals and nursing note reviewed.  Constitutional:      Appearance: Normal appearance.  HENT:     Head: Normocephalic and atraumatic.     Mouth/Throat:     Pharynx: Oropharynx is clear.  Eyes:     Pupils: Pupils are equal, round, and reactive to light.  Cardiovascular:     Rate and Rhythm: Normal rate and regular  rhythm.  Pulmonary:     Effort: Pulmonary effort is normal.     Breath sounds: Normal breath sounds.  Abdominal:     General: Abdomen is flat.     Palpations: Abdomen is soft.  Musculoskeletal:        General: Normal range of motion.  Skin:    General: Skin is warm and dry.  Neurological:     General: No focal deficit present.     Mental Status: He is alert. Mental status is at baseline.  Psychiatric:        Attention and Perception: Attention normal.        Mood and Affect: Mood normal.        Speech: Speech normal.        Behavior: Behavior normal.        Thought Content: Thought content normal.        Cognition and Memory: Cognition normal.        Judgment: Judgment normal.    Review of Systems  Constitutional: Negative.   HENT: Negative.    Eyes: Negative.   Respiratory: Negative.    Cardiovascular: Negative.   Gastrointestinal: Negative.   Musculoskeletal: Negative.   Skin: Negative.   Neurological: Negative.   Psychiatric/Behavioral: Negative.     Blood pressure 110/73, pulse 77, temperature 98.9 F (37.2 C), temperature source Oral, resp. rate 16, height 5\' 10"  (1.778 m), weight 95.3 kg, SpO2 100 %. Body mass index is 30.15 kg/m.  Mental Status Per Nursing Assessment::   On Admission:  NA  Demographic Factors:  Male and Caucasian  Loss Factors: Loss of significant relationship  Historical Factors: NA  Risk Reduction Factors:   Sense of responsibility to family, Living with another person, especially a  relative, Positive social support, and Positive therapeutic relationship  Continued Clinical Symptoms:  Bipolar Disorder:   Mixed State  Cognitive Features That Contribute To Risk:  None    Suicide Risk:  Minimal: No identifiable suicidal ideation.  Patients presenting with no risk factors but with morbid ruminations; may be classified as minimal risk based on the severity of the depressive symptoms    Plan Of Care/Follow-up recommendations:   Other:  Patient seen and chart reviewed.  32 year old man history of minimal past psychiatric conditions presented to the hospital with what sounds like escalating symptoms most consistent with bipolar mania.  Patient was agitated grandiose hyperverbal hyperactive disorganized all on presentation.  Patient was treated with medication for bipolar disorder.  Gradually showed improvement.  Some difficulty establishing full cooperation but eventually was taking medicine regularly.  Contact was maintained with his family.  By the time of discharge patient is finally showing significant improvement.  Physical agitation and mental agitation is greatly improved.  Speech is no longer pressured.  Shows better insight and agrees to stay on medicine and follow-up with outpatient mental health care.  Alethia Berthold, MD 06/10/2022, 9:57 AM
